# Patient Record
Sex: Male | Born: 1985
Health system: Southern US, Community
[De-identification: ages and names within clinical notes are randomized; demographics above are authoritative.]

---
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
## 2005-12-29 HISTORY — PX: SPINE SURGERY: SHX786

## 2018-01-12 DIAGNOSIS — N200 Calculus of kidney: Secondary | ICD-10-CM

## 2018-01-12 HISTORY — DX: Calculus of kidney: N20.0

## 2018-10-14 ENCOUNTER — Ambulatory Visit: Payer: Self-pay | Admitting: Family Medicine

## 2018-10-28 ENCOUNTER — Other Ambulatory Visit: Payer: Self-pay

## 2018-10-28 ENCOUNTER — Ambulatory Visit (INDEPENDENT_AMBULATORY_CARE_PROVIDER_SITE_OTHER): Payer: 59 | Admitting: Family Medicine

## 2018-10-28 ENCOUNTER — Encounter: Payer: Self-pay | Admitting: Family Medicine

## 2018-10-28 VITALS — BP 119/65 | HR 52 | Temp 98.5°F | Wt 166.0 lb

## 2018-10-28 DIAGNOSIS — Z130 Encounter for screening for diseases of the blood and blood-forming organs and certain disorders involving the immune mechanism: Secondary | ICD-10-CM

## 2018-10-28 DIAGNOSIS — Z13220 Encounter for screening for lipoid disorders: Secondary | ICD-10-CM

## 2018-10-28 DIAGNOSIS — Z0001 Encounter for general adult medical examination with abnormal findings: Secondary | ICD-10-CM

## 2018-10-28 DIAGNOSIS — R7989 Other specified abnormal findings of blood chemistry: Secondary | ICD-10-CM

## 2018-10-28 DIAGNOSIS — Z9889 Other specified postprocedural states: Secondary | ICD-10-CM

## 2018-10-28 DIAGNOSIS — Z808 Family history of malignant neoplasm of other organs or systems: Secondary | ICD-10-CM

## 2018-10-28 DIAGNOSIS — Z131 Encounter for screening for diabetes mellitus: Secondary | ICD-10-CM

## 2018-10-28 DIAGNOSIS — Z0000 Encounter for general adult medical examination without abnormal findings: Secondary | ICD-10-CM

## 2018-10-28 DIAGNOSIS — Z1283 Encounter for screening for malignant neoplasm of skin: Secondary | ICD-10-CM

## 2018-10-28 DIAGNOSIS — Z1329 Encounter for screening for other suspected endocrine disorder: Secondary | ICD-10-CM

## 2018-10-28 DIAGNOSIS — Z87442 Personal history of urinary calculi: Secondary | ICD-10-CM

## 2018-10-28 NOTE — Progress Notes (Signed)
Subjective:    Patient ID: Justin Morrow, male    DOB: 07-20-1985, 33 y.o.   MRN: [id number]  HPI Justin Morrow is a 33 y.o. male Presents today for: Chief Complaint  Patient presents with  . Establish Care    Kidney stone several weeks ago and want to discuss with Dr Sara Chu. Hx of abnormal liver enzymes  new patient to establish care.  Previously lived in [place], [place]. 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 Moved for  Trained at [employer], then doctorate in [place].  No chronic meds.  Married, wife is staying at home with 61moold daughter, Justin Morrow   Surgical hx:  Arthroscopic meniscal surgery  R side - ?congenital meniscus issue.  L spine 2March 18, 2007 cervical surgery for spinal stenosis.   [facility]. Prior stone 5 yrs ago. Has not met with urology. No problems since. Rare soda. No hematuria.   Elevated LFTs:  Noted in 203-18-17  Had life insurance refused prior due to elevated GGT/elevated liver tests. Followed by PCP and gastroenterology. Had negative hepatitis testing, U/S, CT scan, contrast study. No source found for elevated readings.  At that time was more overweight - has lost about 10-15 pounds since that time with running, and sugar consumption.  Repeat tests improved about 1.5-2 yrs ago.  Alcohol:about once per month, less than 1 glass.  Acetaminophen: rare - every 2 weeks.  Off supplements.   Would like ENT referral Over 20 surgeries to ears since [place]. Numerous tympanostomy tubes. Dec 19 in left ear had removal of tube.  Reconstructive surgery on R ear in 2003-18-2015 [place]  No acute sx's - needs local ENT established.   Derm referral FH of skin CA - mother deceased form Melanoma in 2003-17-08 Multiple dysplastic nevi in past, no personal melanoma history.  Younger brother with stage 1 melanoma. Derm eval every 4-6 months prior.    There is no  immunization history on file for this patient. Flu vaccine 2 weeks ago at [facility].  Tetanus: will contact prior [facility].   Depression screen PHQ 2/9 10/28/2018  Decreased Interest 0  Down, Depressed, Hopeless 0  PHQ - 2 Score 0   Dentist: not yet.   Exercise: runner.   STI testing - deferred  There is no height or weight on file to calculate BMI.  There are no active problems to display for this patient.  No past medical history on file.  Not on File Prior to Admission medications   Not on File   Social History   Socioeconomic History  . Marital status: Married    Spouse name: Not on file  . Number of children: Not on file  . Years of education: Not on file  . Highest education level: Not on file  Occupational History  . Not on file  Social Needs  . Financial resource strain: Not on file  . Food insecurity    Worry: Not on file    Inability: Not on file  . Transportation needs    Medical: Not on file    Non-medical: Not on file  Tobacco Use  . Smoking status: Not on file  Substance and Sexual Activity  . Alcohol use: Not on file  . Drug use: Not on file  . Sexual activity: Not on file  Lifestyle  . Physical activity    Days per week: Not on file  Minutes per session: Not on file  . Stress: Not on file  Relationships  . Social Herbalist on phone: Not on file    Gets together: Not on file    Attends religious service: Not on file    Active member of club or organization: Not on file    Attends meetings of clubs or organizations: Not on file    Relationship status: Not on file  . Intimate partner violence    Fear of current or ex partner: Not on file    Emotionally abused: Not on file    Physically abused: Not on file    Forced sexual activity: Not on file  Other Topics Concern  . Not on file  Social History Narrative  . Not on file    Review of Systems Per HPI.     Objective:   Physical Exam Vitals signs reviewed.   Constitutional:      Appearance: He is well-developed.  HENT:     Head: Normocephalic and atraumatic.     Right Ear: External ear normal.     Left Ear: External ear normal.  Eyes:     Conjunctiva/sclera: Conjunctivae normal.     Pupils: Pupils are equal, round, and reactive to light.  Neck:     Musculoskeletal: Normal range of motion and neck supple.     Thyroid: No thyromegaly.  Cardiovascular:     Rate and Rhythm: Normal rate and regular rhythm.     Heart sounds: Normal heart sounds.  Pulmonary:     Effort: Pulmonary effort is normal. No respiratory distress.     Breath sounds: Normal breath sounds. No wheezing.  Abdominal:     General: There is no distension.     Palpations: Abdomen is soft.     Tenderness: There is no abdominal tenderness.     Hernia: There is no hernia in the left inguinal area or right inguinal area.  Genitourinary:    Prostate: Normal.  Musculoskeletal: Normal range of motion.        General: No tenderness.  Lymphadenopathy:     Cervical: No cervical adenopathy.  Skin:    General: Skin is warm and dry.  Neurological:     Mental Status: He is alert and oriented to person, place, and time.     Deep Tendon Reflexes: Reflexes are normal and symmetric.  Psychiatric:        Mood and Affect: Mood normal.        Behavior: Behavior normal.    Vitals:   10/28/18 1411  BP: 119/65  Pulse: (!) 52  Temp: 98.5 F (36.9 C)  TempSrc: Oral  SpO2: 98%  Weight: 166 lb (75.3 kg)       Assessment & Plan:  Justin Morrow is a 33 y.o. male Annual physical exam  --anticipatory guidance as below in AVS, screening labs above. Health maintenance items as above in HPI discussed/recommended as applicable.   Elevated LFTs - Plan: Comprehensive metabolic panel, Gamma GT  -Reported history with significant work-up, no known cause.  Check levels, consider local GI eval/follow-up if needed.  Screening for diabetes mellitus - Plan: Comprehensive metabolic panel   Screening for hyperlipidemia - Plan: Lipid Panel  History of nephrolithiasis - Plan: Ambulatory referral to Urology  -2 episodes, refer to urology.  Asymptomatic at present.  Screening for thyroid disorder - Plan: TSH  Screening, anemia, deficiency, iron - Plan: CBC  History of tympanomastoidectomy - Plan: Ambulatory referral to ENT  -  Multiple previous ENT surgeries/procedures.  Asymptomatic at present, refer to ENT for ongoing care/follow-up.  Screening for skin cancer - Plan: Ambulatory referral to Dermatology Family history of malignant melanoma - Plan: Ambulatory referral to Dermatology  -Refer to dermatology to establish and for ongoing melanoma screening.  No orders of the defined types were placed in this encounter.  Patient Instructions   I will refer you to ear nose and throat, dermatology and urology.  Depending on liver function tests can decide on further work-up or referral to gastroenterologist locally.  Thanks for coming in today and please let me know if there are questions.  [facility]  Address: [address], [place], [place] [address]  [email or web address]  Phone: [phone]  [facility], [place] [facility],  or [place] as options for eye care options if needed.   Keeping you healthy  Get these tests  Blood pressure- Have your blood pressure checked once a year by your healthcare provider.  Normal blood pressure is 120/80.  Weight- Have your body mass index (BMI) calculated to screen for obesity.  BMI is a measure of body fat based on height and weight. You can also calculate your own BMI at [employer]velBags.it.  Cholesterol- Have your cholesterol checked regularly starting at age 7, sooner may be necessary if you have diabetes, high blood pressure, if a family member developed heart diseases at an early age or if you smoke.   Chlamydia, HIV, and other sexual transmitted disease- Get screened each year until the age of 26 then within  three months of each new sexual partner.  Diabetes- Have your blood sugar checked regularly if you have high blood pressure, high cholesterol, a family history of diabetes or if you are overweight.  Get these vaccines  Flu shot- Every fall.  Tetanus shot- Every 10 years.  Menactra- Single dose; prevents meningitis.  Take these steps  Don't smoke- If you do smoke, ask your healthcare provider about quitting. For tips on how to quit, go to www.smokefree.gov or call 1-800-QUIT-NOW.  Be physically active- Exercise 5 days a week for at least 30 minutes.  If you are not already physically active start slow and gradually work up to 30 minutes of moderate physical activity.  Examples of moderate activity include walking briskly, mowing the yard, dancing, swimming bicycling, etc.  Eat a healthy diet- Eat a variety of healthy foods such as fruits, vegetables, low fat milk, low fat cheese, yogurt, lean meats, poultry, fish, beans, tofu, etc.  For more information on healthy eating, go to www.thenutritionsource.org  Drink alcohol in moderation- Limit alcohol intake two drinks or less a day.  Never drink and drive.  Dentist- Brush and floss teeth twice daily; visit your dentis twice a year.  Depression-Your emotional health is as important as your physical health.  If you're feeling down, losing interest in things you normally enjoy please talk with your healthcare provider.  Gun Safety- If you keep a gun in your home, keep it unloaded and with the safety lock on.  Bullets should be stored separately.  Helmet use- Always wear a helmet when riding a motorcycle, bicycle, rollerblading or skateboarding.  Safe sex- If you may be exposed to a sexually transmitted infection, use a condom  Seat belts- Seat bels can save your life; always wear one.  Smoke/Carbon Monoxide detectors- These detectors need to be installed on the appropriate level of your home.  Replace batteries at least once a year.   Skin Cancer- When out in  the sun, cover up and use sunscreen SPF 15 or higher.  Violence- If anyone is threatening or hurting you, please tell your healthcare provider.   If you have lab work done today you will be contacted with your lab results within the next 2 weeks.  If you have not heard from [place] then please contact us. The fastest way to get your results is to register for My Chart.   IF you received an x-ray today, you will receive an invoice from [facility]. Please contact [facility] at [phone] with questions or concerns regarding your invoice.   IF you received labwork today, you will receive an invoice from [facility]. Please contact [facility] at [phone] with questions or concerns regarding your invoice.   Our billing staff will not be able to assist you with questions regarding bills from these companies.  You will be contacted with the lab results as soon as they are available. The fastest way to get your results is to activate your My Chart account. Instructions are located on the last page of this paperwork. If you have not heard from [place] regarding the results in 2 weeks, please contact this office.       Signed,   Merri Ray, MD [facility].  10/29/18 10:45 AM

## 2018-10-28 NOTE — Patient Instructions (Addendum)
I will refer you to ear nose and throat, dermatology and urology.  Depending on liver function tests can decide on further work-up or referral to gastroenterologist locally.  Thanks for coming in today and please let me know if there are questions.  [facility]  Address: [address], [place], [place] [address]  [email or web address]  Phone: [phone]  [facility] Ables, [place] [facility],  or [place] as options for eye care options if needed.   Keeping you healthy  Get these tests  Blood pressure- Have your blood pressure checked once a year by your healthcare provider.  Normal blood pressure is 120/80.  Weight- Have your body mass index (BMI) calculated to screen for obesity.  BMI is a measure of body fat based on height and weight. You can also calculate your own BMI at [employer]Bags[employer]it.  Cholesterol- Have your cholesterol checked regularly starting at age 47, sooner may be necessary if you have diabetes, high blood pressure, if a family member developed heart diseases at an early age or if you smoke.   Chlamydia, HIV, and other sexual transmitted disease- Get screened each year until the age of 67 then within three months of each new sexual partner.  Diabetes- Have your blood sugar checked regularly if you have high blood pressure, high cholesterol, a family history of diabetes or if you are overweight.  Get these vaccines  Flu shot- Every fall.  Tetanus shot- Every 10 years.  Menactra- Single dose; prevents meningitis.  Take these steps  Don't smoke- If you do smoke, ask your healthcare provider about quitting. For tips on how to quit, go to www.smokefree.gov or call 1-800-QUIT-NOW.  Be physically active- Exercise 5 days a week for at least 30 minutes.  If you are not already physically active start slow and gradually work up to 30 minutes of moderate physical activity.  Examples of moderate activity include walking briskly, mowing the yard, dancing,  swimming bicycling, etc.  Eat a healthy diet- Eat a variety of healthy foods such as fruits, vegetables, low fat milk, low fat cheese, yogurt, lean meats, poultry, fish, beans, tofu, etc.  For more information on healthy eating, go to www.thenutritionsource.org  Drink alcohol in moderation- Limit alcohol intake two drinks or less a day.  Never drink and drive.  Dentist- Brush and floss teeth twice daily; visit your dentis twice a year.  Depression-Your emotional health is as important as your physical health.  If you're feeling down, losing interest in things you normally enjoy please talk with your healthcare provider.  Gun Safety- If you keep a gun in your home, keep it unloaded and with the safety lock on.  Bullets should be stored separately.  Helmet use- Always wear a helmet when riding a motorcycle, bicycle, rollerblading or skateboarding.  Safe sex- If you may be exposed to a sexually transmitted infection, use a condom  Seat belts- Seat bels can save your life; always wear one.  Smoke/Carbon Monoxide detectors- These detectors need to be installed on the appropriate level of your home.  Replace batteries at least once a year.  Skin Cancer- When out in the sun, cover up and use sunscreen SPF 15 or higher.  Violence- If anyone is threatening or hurting you, please tell your healthcare provider.   If you have lab work done today you will be contacted with your lab results within the next 2 weeks.  If you have not heard from [place] then please contact us. The fastest way to get your  results is to register for My Chart.   IF you received an x-ray today, you will receive an invoice from [facility]. Please contact [facility] at [phone] with questions or concerns regarding your invoice.   IF you received labwork today, you will receive an invoice from [place] [facility]. Please contact [facility] at [phone] with questions or concerns regarding your invoice.   Our billing  staff will not be able to assist you with questions regarding bills from these companies.  You will be contacted with the lab results as soon as they are available. The fastest way to get your results is to activate your My Chart account. Instructions are located on the last page of this paperwork. If you have not heard from [place] regarding the results in 2 weeks, please contact this office.

## 2018-10-29 ENCOUNTER — Encounter: Payer: Self-pay | Admitting: Family Medicine

## 2018-10-29 LAB — LIPID PANEL
Chol/HDL Ratio: 3.7 ratio (ref 0.0–5.0)
Cholesterol, Total: 201 mg/dL — ABNORMAL HIGH (ref 100–199)
HDL: 55 mg/dL (ref >39)
LDL Chol Calc (NIH): 126 mg/dL — ABNORMAL HIGH (ref 0–99)
Triglycerides: 110 mg/dL (ref 0–149)
VLDL Cholesterol Cal: 20 mg/dL (ref 5–40)

## 2018-10-29 LAB — CBC
Hematocrit: 40.9 % (ref 37.5–51.0)
Hemoglobin: 14 g/dL (ref 13.0–17.7)
MCH: 29.8 pg (ref 26.6–33.0)
MCHC: 34.2 g/dL (ref 31.5–35.7)
MCV: 87 fL (ref 79–97)
Platelets: 280 10*3/uL (ref 150–450)
RBC: 4.7 x10E6/uL (ref 4.14–5.80)
RDW: 12.7 % (ref 11.6–15.4)
WBC: 6.9 10*3/uL (ref 3.4–10.8)

## 2018-10-29 LAB — COMPREHENSIVE METABOLIC PANEL
ALT: 52 IU/L — ABNORMAL HIGH (ref 0–44)
AST: 33 IU/L (ref 0–40)
Albumin/Globulin Ratio: 1.8 (ref 1.2–2.2)
Albumin: 4.7 g/dL (ref 4.0–5.0)
Alkaline Phosphatase: 66 IU/L (ref 39–117)
BUN/Creatinine Ratio: 12 (ref 9–20)
BUN: 11 mg/dL (ref 6–20)
Bilirubin Total: 0.5 mg/dL (ref 0.0–1.2)
CO2: 22 mmol/L (ref 20–29)
Calcium: 9.8 mg/dL (ref 8.7–10.2)
Chloride: 105 mmol/L (ref 96–106)
Creatinine, Ser: 0.92 mg/dL (ref 0.76–1.27)
GFR calc Af Amer: 125 mL/min/{1.73_m2} (ref >59)
GFR calc non Af Amer: 108 mL/min/{1.73_m2} (ref >59)
Globulin, Total: 2.6 g/dL (ref 1.5–4.5)
Glucose: 101 mg/dL — ABNORMAL HIGH (ref 65–99)
Potassium: 4.4 mmol/L (ref 3.5–5.2)
Sodium: 140 mmol/L (ref 134–144)
Total Protein: 7.3 g/dL (ref 6.0–8.5)

## 2018-10-29 LAB — TSH: TSH: 1.18 u[IU]/mL (ref 0.450–4.500)

## 2018-10-29 LAB — GAMMA GT: GGT: 198 IU/L — ABNORMAL HIGH (ref 0–65)

## 2019-04-13 ENCOUNTER — Ambulatory Visit: Payer: 59 | Admitting: Family Medicine

## 2019-06-16 ENCOUNTER — Other Ambulatory Visit: Payer: Self-pay

## 2019-06-16 ENCOUNTER — Ambulatory Visit (INDEPENDENT_AMBULATORY_CARE_PROVIDER_SITE_OTHER): Payer: No Typology Code available for payment source | Admitting: Family Medicine

## 2019-06-16 ENCOUNTER — Encounter: Payer: Self-pay | Admitting: Physician Assistant

## 2019-06-16 ENCOUNTER — Ambulatory Visit (INDEPENDENT_AMBULATORY_CARE_PROVIDER_SITE_OTHER): Payer: No Typology Code available for payment source

## 2019-06-16 ENCOUNTER — Encounter: Payer: Self-pay | Admitting: Family Medicine

## 2019-06-16 VITALS — BP 122/73 | HR 87 | Temp 98.6°F | Ht 66.0 in | Wt 162.0 lb

## 2019-06-16 DIAGNOSIS — Z23 Encounter for immunization: Secondary | ICD-10-CM

## 2019-06-16 DIAGNOSIS — R739 Hyperglycemia, unspecified: Secondary | ICD-10-CM

## 2019-06-16 DIAGNOSIS — E785 Hyperlipidemia, unspecified: Secondary | ICD-10-CM

## 2019-06-16 DIAGNOSIS — Z1283 Encounter for screening for malignant neoplasm of skin: Secondary | ICD-10-CM | POA: Diagnosis not present

## 2019-06-16 DIAGNOSIS — Z9889 Other specified postprocedural states: Secondary | ICD-10-CM | POA: Diagnosis not present

## 2019-06-16 DIAGNOSIS — M25561 Pain in right knee: Secondary | ICD-10-CM

## 2019-06-16 DIAGNOSIS — R7989 Other specified abnormal findings of blood chemistry: Secondary | ICD-10-CM | POA: Diagnosis not present

## 2019-06-16 DIAGNOSIS — Z808 Family history of malignant neoplasm of other organs or systems: Secondary | ICD-10-CM

## 2019-06-16 NOTE — Progress Notes (Signed)
Subjective:  Patient ID: Justin Morrow, male    DOB: Jul 02, 1985  Age: 34 y.o. MRN: [id number]  CC:  Chief Complaint  Patient presents with   Liver recheck    Pt is here for a 6 month recheck of his liver.   Referral    to dermatology and ENT. Pt has new insurance that requiers a referral to see these providers.    Knee Pain    Pt is experiancing some pain in his R knee. no known trama to area. started about 2-3 months ago. pain is come and go. pain is sharp; pain level is a 5/10 on it's worst day.    HPI Jamarl Pew presents for   Elevated LFTs: Discussed at initial visit in October 2020.  Had significant work-up previously without known cause, including reported hepatitis testing, ultrasound, CT scans, contrast studies.  Had reported being overweight at that time but lost 10 to 15 pounds, with improved readings approximately 2 years ago.  Alcohol less than once per month, rare acetaminophen - once per month.   Plan for continued monitoring. Would like to meet with GI locally.  No n/v/abd pain.   Lab Results  Component Value Date   ALT 52 (H) 10/28/2018   AST 33 10/28/2018   GGT 198 (H) 10/28/2018   ALKPHOS 66 10/28/2018   BILITOT 0.5 10/28/2018    ENT referral, history of TM tubes, prior surgeries previous referral in October. Never saw ENT - did not receive call.   Dermatology referral, family history of skin cancer, referred previously, new referral needed for insurance purposes. Dr. Wilhemina Bonito at [facility].   Hyperglycemia: Borderline with glucose 101 in October 2020.  Mild hyperlipidemia with total cholesterol 201, LDL 126 at that time.   Right knee pain: 4-5 months. Sporadic, sometimes at beginning of run then improves.  NKI.  Running, with more hills here, some increased mileage.  Tried resistance band training - helped some. Less frequent and milder pain. Locking/giving way a few times - last one few days ago.  No swelling.  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  Tx: none.   Moderna vaccine in February.   History There are no problems to display for this patient.  No past medical history on file. Past Surgical History:  Procedure Laterality Date   KNEE ARTHROSCOPY  1999   SPINE SURGERY  12/29/2005   No Known Allergies Prior to Admission medications   Medication Sig Start Date End Date Taking? Authorizing Provider  hydrocortisone 1 % ointment Apply 1 application topically 2 (two) times daily.    [provider]  Pyrithione Zinc (DHS ZINC) 2 % SHAM Apply topically.    [provider]   Social History   Socioeconomic History   Marital status: Married    Spouse name: Not on file   Number of children: Not on file   Years of education: Not on file   Highest education level: Not on file  Occupational History   Not on file  Tobacco Use   Smoking status: Never Smoker   Smokeless tobacco: Never Used  Substance and Sexual Activity   Alcohol use: Yes    Alcohol/week: 1.0 standard drinks    Types: 1 Glasses of wine per week    Comment: once a month   Drug use: Never   Sexual activity: Yes  Other Topics Concern   Not on file  Social History Narrative   Not on file  Social Determinants of Health   Financial Resource Strain:    Difficulty of Paying Living Expenses:   Food Insecurity:    Worried About Charity fundraiser in the Last Year:    Arboriculturist in the Last Year:   Transportation Needs:    Film/video editor (Medical):    Lack of Transportation (Non-Medical):   Physical Activity:    Days of Exercise per Week:    Minutes of Exercise per Session:   Stress:    Feeling of Stress :   Social Connections:    Frequency of Communication with Friends and Family:    Frequency of Social Gatherings with Friends and Family:    Attends Religious Services:    Active Member of Clubs or Organizations:    Attends Archivist Meetings:      Marital Status:   Intimate Partner Violence:    Fear of Current or Ex-Partner:    Emotionally Abused:    Physically Abused:    Sexually Abused:     Review of Systems   Objective:   Vitals:   06/16/19 0821  BP: 122/73  Pulse: 87  Temp: 98.6 F (37 C)  TempSrc: Temporal  SpO2: 98%  Weight: 162 lb (73.5 kg)  Height: 5\' 6"  (1.676 m)     Physical Exam Vitals reviewed.  Constitutional:      Appearance: He is well-developed.  HENT:     Head: Normocephalic and atraumatic.  Eyes:     Pupils: Pupils are equal, round, and reactive to light.  Neck:     Vascular: No carotid bruit or JVD.  Cardiovascular:     Rate and Rhythm: Normal rate and regular rhythm.     Heart sounds: Normal heart sounds. No murmur.  Pulmonary:     Effort: Pulmonary effort is normal.     Breath sounds: Normal breath sounds. No rales.  Musculoskeletal:     Comments: Right knee Skin intact, no effusion, no erythema.  No focal bony tenderness.  Pain-free range of motion.  Slight J sign at patella with flexion extension.  Minimal pain with patellar compression/Clark's.  Negative McMurray, negative Lachman, negative varus and valgus stress testing.  Skin:    General: Skin is warm and dry.  Neurological:     Mental Status: He is alert and oriented to person, place, and time.       Assessment & Plan:  Antonia Culbertson is a 33 y.o. male . Elevated LFTs - Plan: Comprehensive metabolic panel, Gamma GT, Ambulatory referral to Gastroenterology  -Repeat testing, asymptomatic.  Previous evaluation apparently without known cause, referred to GI locally.  Need for prophylactic vaccination with combined diphtheria-tetanus-pertussis (DTP) vaccine - Plan: Tdap vaccine greater than or equal to 7yo IM  History of tympanomastoidectomy - Plan: Ambulatory referral to ENT  Screening for skin cancer - Plan: Ambulatory referral to Dermatology Family history of malignant melanoma - Plan: Ambulatory referral to  Dermatology  Hyperglycemia - Plan: Comprehensive metabolic panel  Right knee pain, unspecified chronicity - Plan: DG Knee Complete 4 Views Right, Ambulatory referral to Physical Therapy  -Exam suspicious for patellofemoral syndrome.  Did have meniscal repair when younger, possible discoid meniscus.  Exam reassuring, including McMurray's at this time without effusion.  Reported locking but again reassuring exam at present.  Check x-ray, VMO strengthening, handout given, PT eval, recheck 3 to 4 weeks.  Consider advanced imaging or orthopedic evaluation if persistent.  Hyperlipidemia, unspecified hyperlipidemia type - Plan: Lipid panel  -  Mild elevation previously.  Repeat labs.  No orders of the defined types were placed in this encounter.  Patient Instructions    Knee pain could be component of patellofemoral pain syndrome.  See information below.  With previous meniscus repair, that is also a possibility but reassuring exam today.  I will let you know if there are concerns on x-ray but we can try physical therapy first.  If not improving in the next 3 to 4 weeks, follow-up as may need MRI or orthopedic evaluation.  Follow-up sooner if worse.  I will refer you to ear nose and throat, dermatology and gastroenterology, repeat labs today.  Thanks for coming in. Patellofemoral Pain Syndrome  Patellofemoral pain syndrome is a condition in which the tissue (cartilage) on the underside of the kneecap (patella) softens or breaks down. This causes pain in the front of the knee. The condition is also called runner's knee or chondromalacia patella. Patellofemoral pain syndrome is most common in young adults who are active in sports. The knee is the largest joint in the body. The patella covers the front of the knee and is attached to muscles above and below the knee. The underside of the patella is covered with a smooth type of cartilage (synovium). The smooth surface helps the patella to glide easily when  you move your knee. Patellofemoral pain syndrome causes swelling in the joint linings and bone surfaces in the knee. What are the causes? This condition may be caused by:  Overuse of the knee.  Poor alignment of your knee joints.  Weak leg muscles.  A direct blow to your kneecap. What increases the risk? You are more likely to develop this condition if:  You do a lot of activities that can wear down your kneecap. These include: ? Running. ? Squatting. ? Climbing stairs.  You start a new physical activity or exercise program.  You wear shoes that do not fit well.  You do not have good leg strength.  You are overweight. What are the signs or symptoms? The main symptom of this condition is knee pain. This may feel like a dull, aching pain underneath your patella, in the front of your knee. There may be a popping or cracking sound when you move your knee. Pain may get worse with:  Exercise.  Climbing stairs.  Running.  Jumping.  Squatting.  Kneeling.  Sitting for a long time.  Moving or pushing on your patella. How is this diagnosed? This condition may be diagnosed based on:  Your symptoms and medical history. You may be asked about your recent physical activities and which ones cause knee pain.  A physical exam. This may include: ? Moving your patella back and forth. ? Checking your range of knee motion. ? Having you squat or jump to see if you have pain. ? Checking the strength of your leg muscles.  Imaging tests to confirm the diagnosis. These may include an MRI of your knee. How is this treated? This condition may be treated at home with rest, ice, compression, and elevation (RICE).  Other treatments may include:  Nonsteroidal anti-inflammatory drugs (NSAIDs).  Physical therapy to stretch and strengthen your leg muscles.  Shoe inserts (orthotics) to take stress off your knee.  A knee brace or knee support.  Adhesive tapes to the skin.  Surgery to  remove damaged cartilage or move the patella to a better position. This is rare. Follow these instructions at home: If you have a shoe or brace:  Wear the shoe or brace as told by your health care provider. Remove it only as told by your health care provider.  Loosen the shoe or brace if your toes tingle, become numb, or turn cold and blue.  Keep the shoe or brace clean.  If the shoe or brace is not waterproof: ? Do not let it get wet. ? Cover it with a watertight covering when you take a bath or a shower. Managing pain, stiffness, and swelling  If directed, put ice on the painful area. ? If you have a removable shoe or brace, remove it as told by your health care provider. ? Put ice in a plastic bag. ? Place a towel between your skin and the bag. ? Leave the ice on for 20 minutes, 2-3 times a day.  Move your toes often to avoid stiffness and to lessen swelling.  Rest your knee: ? Avoid activities that cause knee pain. ? When sitting or lying down, raise (elevate) the injured area above the level of your heart, whenever possible. General instructions  Take over-the-counter and prescription medicines only as told by your health care provider.  Use splints, braces, knee supports, or walking aids as directed by your health care provider.  Perform stretching and strengthening exercises as told by your health care provider or physical therapist.  Do not use any products that contain nicotine or tobacco, such as cigarettes and e-cigarettes. These can delay healing. If you need help quitting, ask your health care provider.  Return to your normal activities as told by your health care provider. Ask your health care provider what activities are safe for you.  Keep all follow-up visits as told by your health care provider. This is important. Contact a health care provider if:  Your symptoms get worse.  You are not improving with home care. Summary  Patellofemoral pain syndrome is  a condition in which the tissue (cartilage) on the underside of the kneecap (patella) softens or breaks down.  This condition causes swelling in the joint linings and bone surfaces in the knee. This leads to pain in the front of the knee.  This condition may be treated at home with rest, ice, compression, and elevation (RICE).  Use splints, braces, knee supports, or walking aids as directed by your health care provider. This information is not intended to replace advice given to you by your health care provider. Make sure you discuss any questions you have with your health care provider. Document Revised: 02/08/2017 Document Reviewed: 02/08/2017 Elsevier Patient Education  [employer].   If you have lab work done today you will be contacted with your lab results within the next 2 weeks.  If you have not heard from [place] then please contact us. The fastest way to get your results is to register for My Chart.   IF you received an x-ray today, you will receive an invoice from [facility]. Please contact [facility] at [phone] with questions or concerns regarding your invoice.   IF you received labwork today, you will receive an invoice from [facility]. Please contact [facility] at [phone] with questions or concerns regarding your invoice.   Our billing staff will not be able to assist you with questions regarding bills from these companies.  You will be contacted with the lab results as soon as they are available. The fastest way to get your results is to activate your My Chart account. Instructions are located on the last page of this paperwork. If  you have not heard from [place] regarding the results in 2 weeks, please contact this office.         Signed, Merri Ray, MD [facility]

## 2019-06-16 NOTE — Patient Instructions (Addendum)
Knee pain could be component of patellofemoral pain syndrome.  See information below.  With previous meniscus repair, that is also a possibility but reassuring exam today.  I will let you know if there are concerns on x-ray but we can try physical therapy first.  If not improving in the next 3 to 4 weeks, follow-up as may need MRI or orthopedic evaluation.  Follow-up sooner if worse.  I will refer you to ear nose and throat, dermatology and gastroenterology, repeat labs today.  Thanks for coming in. Patellofemoral Pain Syndrome  Patellofemoral pain syndrome is a condition in which the tissue (cartilage) on the underside of the kneecap (patella) softens or breaks down. This causes pain in the front of the knee. The condition is also called runner's knee or chondromalacia patella. Patellofemoral pain syndrome is most common in young adults who are active in sports. The knee is the largest joint in the body. The patella covers the front of the knee and is attached to muscles above and below the knee. The underside of the patella is covered with a smooth type of cartilage (synovium). The smooth surface helps the patella to glide easily when you move your knee. Patellofemoral pain syndrome causes swelling in the joint linings and bone surfaces in the knee. What are the causes? This condition may be caused by:  Overuse of the knee.  Poor alignment of your knee joints.  Weak leg muscles.  A direct blow to your kneecap. What increases the risk? You are more likely to develop this condition if:  You do a lot of activities that can wear down your kneecap. These include: ? Running. ? Squatting. ? Climbing stairs.  You start a new physical activity or exercise program.  You wear shoes that do not fit well.  You do not have good leg strength.  You are overweight. What are the signs or symptoms? The main symptom of this condition is knee pain. This may feel like a dull, aching pain underneath  your patella, in the front of your knee. There may be a popping or cracking sound when you move your knee. Pain may get worse with:  Exercise.  Climbing stairs.  Running.  Jumping.  Squatting.  Kneeling.  Sitting for a long time.  Moving or pushing on your patella. How is this diagnosed? This condition may be diagnosed based on:  Your symptoms and medical history. You may be asked about your recent physical activities and which ones cause knee pain.  A physical exam. This may include: ? Moving your patella back and forth. ? Checking your range of knee motion. ? Having you squat or jump to see if you have pain. ? Checking the strength of your leg muscles.  Imaging tests to confirm the diagnosis. These may include an MRI of your knee. How is this treated? This condition may be treated at home with rest, ice, compression, and elevation (RICE).  Other treatments may include:  Nonsteroidal anti-inflammatory drugs (NSAIDs).  Physical therapy to stretch and strengthen your leg muscles.  Shoe inserts (orthotics) to take stress off your knee.  A knee brace or knee support.  Adhesive tapes to the skin.  Surgery to remove damaged cartilage or move the patella to a better position. This is rare. Follow these instructions at home: If you have a shoe or brace:  Wear the shoe or brace as told by your health care provider. Remove it only as told by your health care provider.  Loosen the  shoe or brace if your toes tingle, become numb, or turn cold and blue.  Keep the shoe or brace clean.  If the shoe or brace is not waterproof: ? Do not let it get wet. ? Cover it with a watertight covering when you take a bath or a shower. Managing pain, stiffness, and swelling  If directed, put ice on the painful area. ? If you have a removable shoe or brace, remove it as told by your health care provider. ? Put ice in a plastic bag. ? Place a towel between your skin and the bag. ? Leave  the ice on for 20 minutes, 2-3 times a day.  Move your toes often to avoid stiffness and to lessen swelling.  Rest your knee: ? Avoid activities that cause knee pain. ? When sitting or lying down, raise (elevate) the injured area above the level of your heart, whenever possible. General instructions  Take over-the-counter and prescription medicines only as told by your health care provider.  Use splints, braces, knee supports, or walking aids as directed by your health care provider.  Perform stretching and strengthening exercises as told by your health care provider or physical therapist.  Do not use any products that contain nicotine or tobacco, such as cigarettes and e-cigarettes. These can delay healing. If you need help quitting, ask your health care provider.  Return to your normal activities as told by your health care provider. Ask your health care provider what activities are safe for you.  Keep all follow-up visits as told by your health care provider. This is important. Contact a health care provider if:  Your symptoms get worse.  You are not improving with home care. Summary  Patellofemoral pain syndrome is a condition in which the tissue (cartilage) on the underside of the kneecap (patella) softens or breaks down.  This condition causes swelling in the joint linings and bone surfaces in the knee. This leads to pain in the front of the knee.  This condition may be treated at home with rest, ice, compression, and elevation (RICE).  Use splints, braces, knee supports, or walking aids as directed by your health care provider. This information is not intended to replace advice given to you by your health care provider. Make sure you discuss any questions you have with your health care provider. Document Revised: 02/08/2017 Document Reviewed: 02/08/2017 Elsevier Patient Education  [employer].   If you have lab work done today you will be contacted with your lab  results within the next 2 weeks.  If you have not heard from [place] then please contact us. The fastest way to get your results is to register for My Chart.   IF you received an x-ray today, you will receive an invoice from [facility]. Please contact [facility] at [phone] with questions or concerns regarding your invoice.   IF you received labwork today, you will receive an invoice from [facility]. Please contact [facility] at [phone] with questions or concerns regarding your invoice.   Our billing staff will not be able to assist you with questions regarding bills from these companies.  You will be contacted with the lab results as soon as they are available. The fastest way to get your results is to activate your My Chart account. Instructions are located on the last page of this paperwork. If you have not heard from [place] regarding the results in 2 weeks, please contact this office.

## 2019-06-17 LAB — LIPID PANEL
Chol/HDL Ratio: 4.1 ratio (ref 0.0–5.0)
Cholesterol, Total: 190 mg/dL (ref 100–199)
HDL: 46 mg/dL (ref >39)
LDL Chol Calc (NIH): 118 mg/dL — ABNORMAL HIGH (ref 0–99)
Triglycerides: 147 mg/dL (ref 0–149)
VLDL Cholesterol Cal: 26 mg/dL (ref 5–40)

## 2019-06-17 LAB — COMPREHENSIVE METABOLIC PANEL
ALT: 57 IU/L — ABNORMAL HIGH (ref 0–44)
AST: 39 IU/L (ref 0–40)
Albumin/Globulin Ratio: 2 (ref 1.2–2.2)
Albumin: 4.9 g/dL (ref 4.0–5.0)
Alkaline Phosphatase: 83 IU/L (ref 48–121)
BUN/Creatinine Ratio: 13 (ref 9–20)
BUN: 15 mg/dL (ref 6–20)
Bilirubin Total: 0.6 mg/dL (ref 0.0–1.2)
CO2: 23 mmol/L (ref 20–29)
Calcium: 10 mg/dL (ref 8.7–10.2)
Chloride: 101 mmol/L (ref 96–106)
Creatinine, Ser: 1.12 mg/dL (ref 0.76–1.27)
GFR calc Af Amer: 98 mL/min/{1.73_m2} (ref >59)
GFR calc non Af Amer: 85 mL/min/{1.73_m2} (ref >59)
Globulin, Total: 2.5 g/dL (ref 1.5–4.5)
Glucose: 88 mg/dL (ref 65–99)
Potassium: 4.2 mmol/L (ref 3.5–5.2)
Sodium: 140 mmol/L (ref 134–144)
Total Protein: 7.4 g/dL (ref 6.0–8.5)

## 2019-06-17 LAB — GAMMA GT: GGT: 265 IU/L — ABNORMAL HIGH (ref 0–65)

## 2019-06-20 ENCOUNTER — Telehealth: Payer: Self-pay

## 2019-06-20 NOTE — Telephone Encounter (Signed)
Pt referral for GI pt insurance needs more information.

## 2019-06-26 ENCOUNTER — Encounter: Payer: Self-pay | Admitting: Family Medicine

## 2019-06-26 NOTE — Telephone Encounter (Signed)
Okay to change pt follow up from July 9th to august 9th due to late getting into PT is this okay?

## 2019-06-27 ENCOUNTER — Encounter: Payer: Self-pay | Admitting: Family Medicine

## 2019-06-27 NOTE — Telephone Encounter (Signed)
No problem at all.  Will send to scheduling to reschedule that appointment.  Thanks.

## 2019-07-11 ENCOUNTER — Ambulatory Visit: Payer: No Typology Code available for payment source | Admitting: Physician Assistant

## 2019-07-11 ENCOUNTER — Other Ambulatory Visit (INDEPENDENT_AMBULATORY_CARE_PROVIDER_SITE_OTHER): Payer: No Typology Code available for payment source

## 2019-07-11 ENCOUNTER — Encounter: Payer: Self-pay | Admitting: Physician Assistant

## 2019-07-11 VITALS — BP 110/74 | HR 68 | Ht 65.0 in | Wt 161.2 lb

## 2019-07-11 DIAGNOSIS — R7989 Other specified abnormal findings of blood chemistry: Secondary | ICD-10-CM

## 2019-07-11 LAB — CBC WITH DIFFERENTIAL/PLATELET
Basophils Absolute: 0.1 10*3/uL (ref 0.0–0.1)
Basophils Relative: 1.3 % (ref 0.0–3.0)
Eosinophils Absolute: 0.3 10*3/uL (ref 0.0–0.7)
Eosinophils Relative: 4.8 % (ref 0.0–5.0)
HCT: 40.9 % (ref 39.0–52.0)
Hemoglobin: 13.7 g/dL (ref 13.0–17.0)
Lymphocytes Relative: 32.7 % (ref 12.0–46.0)
Lymphs Abs: 1.8 10*3/uL (ref 0.7–4.0)
MCHC: 33.4 g/dL (ref 30.0–36.0)
MCV: 88.6 fl (ref 78.0–100.0)
Monocytes Absolute: 0.4 10*3/uL (ref 0.1–1.0)
Monocytes Relative: 6.6 % (ref 3.0–12.0)
Neutro Abs: 3 10*3/uL (ref 1.4–7.7)
Neutrophils Relative %: 54.6 % (ref 43.0–77.0)
Platelets: 290 10*3/uL (ref 150.0–400.0)
RBC: 4.61 Mil/uL (ref 4.22–5.81)
RDW: 13.8 % (ref 11.5–15.5)
WBC: 5.5 10*3/uL (ref 4.0–10.5)

## 2019-07-11 LAB — HEPATIC FUNCTION PANEL
ALT: 40 U/L (ref 0–53)
AST: 24 U/L (ref 0–37)
Albumin: 4.7 g/dL (ref 3.5–5.2)
Alkaline Phosphatase: 60 U/L (ref 39–117)
Bilirubin, Direct: 0.2 mg/dL (ref 0.0–0.3)
Total Bilirubin: 1 mg/dL (ref 0.2–1.2)
Total Protein: 7.4 g/dL (ref 6.0–8.3)

## 2019-07-11 LAB — IBC + FERRITIN
Ferritin: 78.3 ng/mL (ref 22.0–322.0)
Iron: 108 ug/dL (ref 42–165)
Saturation Ratios: 29.7 % (ref 20.0–50.0)
Transferrin: 260 mg/dL (ref 212.0–360.0)

## 2019-07-11 LAB — PROTIME-INR
INR: 1 ratio (ref 0.8–1.0)
Prothrombin Time: 11.2 s (ref 9.6–13.1)

## 2019-07-11 LAB — IGA: IgA: 223 mg/dL (ref 68–378)

## 2019-07-11 NOTE — Progress Notes (Signed)
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     08/25/2016 ultrasound the right upper quadrant for elevated liver enzymes was completely normal.    11/02/2016 CBC normal CMP with an ALT elevated at 54, GGT 285 and otherwise normal.  Iron studies normal.  TSH normal.  Alpha-1 antitrypsin, ceruloplasmin, IgA, IgG all normal.  ANA antibody titer was abnormal at 1: 160-homogenous, hepatitis studies negative/normal, ASMA antibody was detected, AMA normal and TTG IgA normal.    11/11/2016 CT of abdomen with contrast showed normal liver appearance without overt enlargement lobulation or scarring.    10/28/2018 ALT elevated at 52, GGT 198, CMP and CBC otherwise normal.  06/16/2019 ALT 57, GGT 265, CMP and CBC otherwise normal.     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  Explains that his liver enzymes have been elevated for at least the past 3 years.  He had a full work-up and saw GI when he lived in [place], he brought these records with him, it is as above.  Explains that they could not find a cause at all and told him to try and lose some weight.  At that time he weighed around 274 pounds and now is down to 161.  He did change his diet slightly and is trying to eat even more healthy.  Also exercises on a regular basis.  He tells me he rarely drinks an alcoholic drink, maybe once every 3 months.  He wonders why this is still elevated.    Father of a 10-year-old.    Denies fever, chills, weight loss, change in bowel habits, abdominal pain, heartburn, reflux, family history of liver disease, IV drug use, supplement use or previous blood transfusions.  History reviewed. No pertinent past medical history.  Past Surgical History:  Procedure Laterality Date  . KNEE ARTHROSCOPY  1999  . SPINE SURGERY  12/29/2005     Current Outpatient Medications  Medication Sig Dispense Refill  . hydrocortisone 1 % ointment Apply 1 application topically 2 (two) times daily.    . Pyrithione Zinc (DHS ZINC) 2 % SHAM Apply topically.     No current facility-administered medications for this visit.    Allergies as of 07/11/2019  . (No Known Allergies)    Family History  Problem Relation Age of Onset  . Cancer Mother   . Healthy Father   . Healthy Sister   . Healthy Brother   . Colon cancer Neg Hx   . Stomach cancer Neg Hx   . Pancreatic cancer Neg Hx   . Esophageal cancer Neg Hx     Social History   Socioeconomic History  . Marital status: Married    Spouse name: Not on file  . Number of children: Not on file  . Years of education: Not on file  . Highest education level: Not on file  Occupational History  . Not on file  Tobacco Use  . Smoking status: Never Smoker  . Smokeless tobacco: Never Used  Vaping Use  . Vaping Use: Never used  Substance and Sexual Activity  . Alcohol use: Yes    Alcohol/week: 1.0 standard drink    Types: 1 Glasses of wine per week    Comment: once a month  . Drug use: Never  . Sexual activity:  Yes  Other Topics Concern  . Not on file  Social History Narrative  . Not on file   Social Determinants of Health   Financial Resource Strain:   . Difficulty of Paying Living Expenses:   Food Insecurity:   . Worried About Charity fundraiser in the Last Year:   . Arboriculturist in the Last Year:   Transportation Needs:   . Film/video editor (Medical):   Marland Kitchen Lack of Transportation (Non-Medical):   Physical Activity:   . Days of Exercise per Week:   . Minutes of Exercise per Session:   Stress:   . Feeling of Stress :   Social Connections:   . Frequency of Communication with Friends and Family:   . Frequency of Social Gatherings with Friends and Family:   . Attends Religious Services:   . Active Member of Clubs or Organizations:   . Attends Theatre manager Meetings:   Marland Kitchen Marital Status:   Intimate Partner Violence:   . Fear of Current or Ex-Partner:   . Emotionally Abused:   Marland Kitchen Physically Abused:   . Sexually Abused:     Review of Systems:    Constitutional: No weight loss, fever or chills Skin: No rash  Cardiovascular: No chest pain Respiratory: No SOB  Gastrointestinal: See HPI and otherwise negative Genitourinary: No dysuria Neurological: No headache, dizziness or syncope Musculoskeletal: No new muscle or joint pain Hematologic: No bleeding  Psychiatric: No history of depression or anxiety   Physical Exam:  Vital signs: BP 110/74   Pulse 68   Ht 5\' 5"  (1.651 m)   Wt 161 lb 4 oz (73.1 kg)   BMI 26.83 kg/m   Constitutional:   Pleasant Caucasian male appears to be in NAD, Well developed, Well nourished, alert and cooperative Head:  Normocephalic and atraumatic. Eyes:   PEERL, EOMI. No icterus. Conjunctiva pink. Ears:  Normal auditory acuity. Neck:  Supple Throat: Oral cavity and pharynx without inflammation, swelling or lesion.  Respiratory: Respirations even and unlabored. Lungs clear to auscultation bilaterally.   No wheezes, crackles, or rhonchi.  Cardiovascular: Normal S1, S2. No MRG. Regular rate and rhythm. No peripheral edema, cyanosis or pallor.  Gastrointestinal:  Soft, nondistended, nontender. No rebound or guarding. Normal bowel sounds. No appreciable masses or hepatomegaly. Rectal:  Not performed.  Msk:  Symmetrical without gross deformities. Without edema, no deformity or joint abnormality.  Neurologic:  Alert and  oriented x4;  grossly normal neurologically.  Skin:   Dry and intact without significant lesions or rashes. Psychiatric: Demonstrates good judgement and reason without abnormal affect or behaviors.  RELEVANT LABS AND IMAGING (and see HPI): CBC    Component Value Date/Time   WBC 6.9 10/28/2018 1537   RBC 4.70 10/28/2018 1537   HGB 14.0 10/28/2018 1537   HCT 40.9 10/28/2018 1537   PLT  280 10/28/2018 1537   MCV 87 10/28/2018 1537   MCH 29.8 10/28/2018 1537   MCHC 34.2 10/28/2018 1537   RDW 12.7 10/28/2018 1537    CMP     Component Value Date/Time   NA 140 06/16/2019 0910   K 4.2 06/16/2019 0910   CL 101 06/16/2019 0910   CO2 23 06/16/2019 0910   GLUCOSE 88 06/16/2019 0910   BUN 15 06/16/2019 0910   CREATININE 1.12 06/16/2019 0910   CALCIUM 10.0 06/16/2019 0910   PROT 7.4 06/16/2019 0910   ALBUMIN 4.9 06/16/2019 0910   AST 39 06/16/2019 0910   ALT  57 (H) 06/16/2019 0910   ALKPHOS 83 06/16/2019 0910   BILITOT 0.6 06/16/2019 0910   GFRNONAA 85 06/16/2019 0910   GFRAA 98 06/16/2019 0910    Assessment: 1.  Elevated LFTs: Previous work-up in 2018 with positive ASMA and ANA antibody titer 1-1 60-homogenous pattern, ALT and GGT elevated, ultrasound and CT unrevealing, patient is still wondering why this is elevated as there was never a cause found  Plan: 1.  Discussed that I do not feel like this minor elevation in liver enzymes is concerning, but since the patient is concerned will repeat work-up at this time and compare results. 2.  Ordered repeat LFTs, CBC, PT/INR, iron studies, TTG and IgA, ceruloplasmin, ANA, alpha-1 antitrypsin, ASMA, AMA as well as right upper quadrant ultrasound 3.  We will compare results above to previous, if patient is still questioning then could consider liver biopsy in the future if felt necessary. 4.  Patient to follow in clinic per recommendations after lab results above.  He was assigned to Dr. Havery Moros this morning.  Ellouise Newer, PA-C [facility] 07/11/2019, 8:28 AM  Cc: Wendie Agreste, MD

## 2019-07-11 NOTE — Patient Instructions (Signed)
If you are age 35 or older, your body mass index should be between 23-30. Your Body mass index is 26.83 kg/m. If this is out of the aforementioned range listed, please consider follow up with your Primary Care Provider.  If you are age 55 or younger, your body mass index should be between 19-25. Your Body mass index is 26.83 kg/m. If this is out of the aformentioned range listed, please consider follow up with your Primary Care Provider.   Your provider has requested that you go to the basement level for lab work before leaving today. Press "B" on the elevator. The lab is located at the first door on the left as you exit the elevator.  You have been scheduled for an abdominal ultrasound at [facility] (1st floor of hospital) on Wednesday 07/19/19 at 8:30 am. Please arrive 15 minutes prior to your appointment for registration. Make certain not to have anything to eat or drink 6 hours prior to your appointment. Should you need to reschedule your appointment, please contact radiology at [phone]. This test typically takes about 30 minutes to perform.  Due to recent changes in healthcare laws, you may see the results of your imaging and laboratory studies on MyChart before your provider has had a chance to review them.  We understand that in some cases there may be results that are confusing or concerning to you. Not all laboratory results come back in the same time frame and the provider may be waiting for multiple results in order to interpret others.  Please give [place] 48 hours in order for your provider to thoroughly review all the results before contacting the office for clarification of your results.

## 2019-07-12 NOTE — Progress Notes (Signed)
Agree with assessment and plan as outlined. Very mild ALT elevation, negative workup previously. Repeat US is not unreasonable to re-evaluate for steatosis, etc. Otherwise, will await repeat labs. Agree if workup unrevealing and ALT elevation persists, next step would be liver biopsy, although that may be a bit aggressive for such a mild elevation, would have to discuss with him, would otherwise monitor this periodically.

## 2019-07-14 LAB — ANTI-NUCLEAR AB-TITER (ANA TITER)
ANA TITER: 1:320 {titer} — ABNORMAL HIGH
ANA Titer 1: 1:40 {titer} — ABNORMAL HIGH

## 2019-07-14 LAB — ANA: Anti Nuclear Antibody (ANA): POSITIVE — AB

## 2019-07-14 LAB — MITOCHONDRIAL ANTIBODIES: Mitochondrial M2 Ab, IgG: <=20 U

## 2019-07-14 LAB — TISSUE TRANSGLUTAMINASE ABS,IGG,IGA
(tTG) Ab, IgA: 1 U/mL
(tTG) Ab, IgG: 2 U/mL

## 2019-07-14 LAB — ANTI-SMOOTH MUSCLE ANTIBODY, IGG: Actin (Smooth Muscle) Antibody (IGG): <20 U (ref <20)

## 2019-07-14 LAB — CERULOPLASMIN: Ceruloplasmin: 24 mg/dL (ref 18–36)

## 2019-07-14 LAB — ALPHA-1-ANTITRYPSIN: A-1 Antitrypsin, Ser: 133 mg/dL (ref 83–199)

## 2019-07-18 ENCOUNTER — Other Ambulatory Visit: Payer: Self-pay

## 2019-07-18 ENCOUNTER — Encounter: Payer: Self-pay | Admitting: Physical Therapy

## 2019-07-18 ENCOUNTER — Ambulatory Visit: Payer: No Typology Code available for payment source | Attending: Family Medicine | Admitting: Physical Therapy

## 2019-07-18 DIAGNOSIS — M25561 Pain in right knee: Secondary | ICD-10-CM | POA: Insufficient documentation

## 2019-07-18 DIAGNOSIS — G8929 Other chronic pain: Secondary | ICD-10-CM | POA: Insufficient documentation

## 2019-07-18 DIAGNOSIS — R2689 Other abnormalities of gait and mobility: Secondary | ICD-10-CM | POA: Diagnosis present

## 2019-07-18 NOTE — Patient Instructions (Signed)
Access Code: [email or web address] Prepared by: Carolyne Littles  Exercises .Supine Straight Leg Raises - 1 x daily - 7 x weekly - 3 sets - 10 reps .Single Leg Bridge - 1 x daily - 7 x weekly - 3 sets - 10 reps .Side Stepping with Resistance at Thighs - 1 x daily - 7 x weekly - 3 sets - 10 reps .The Diver - 1 x daily - 7 x weekly - 3 sets - 10 reps

## 2019-07-18 NOTE — Therapy (Signed)
[place], [place], [address] Phone: [phone]   Fax:  [phone]  Physical Therapy Evaluation  Patient Details  Name: Justin Morrow MRN: [id number] Date of Birth: [birth year] Referring Provider (PT): Dr. Wendie Agreste    Encounter Date: 07/18/2019   PT End of Session - 07/18/19 0858    Visit Number 1    Number of Visits 6    Date for PT Re-Evaluation 08/29/19    PT Start Time 0800    PT Stop Time 0844    PT Time Calculation (min) 44 min    Activity Tolerance Patient tolerated treatment well    Behavior During Therapy [facility] for tasks assessed/performed           History reviewed. No pertinent past medical history.  Past Surgical History:  Procedure Laterality Date  . KNEE ARTHROSCOPY  1999  . SPINE SURGERY  12/29/2005    There were no vitals filed for this visit.    Subjective Assessment - 07/18/19 0804    Subjective Pt is a 34 y.o. male c/o R knee pain. Pt states pain began 5-6 months ago. Pain is intermittent, most often occuring when he is running. Pt runs 4-5x a week, 5-6 miles at a time. Pain most often occurs below the knee and on medial side and sometimes radiates down the leg. Patient states some occurences of buckling.    Limitations Other (comment)   running   Diagnostic tests X ray: 1. No fracture or dislocation of the right knee. Joint spaces arepreserved. 2. Small incidental exostosis of the proximal right fibula, whichare occasionally symptomatic.    Patient Stated Goals decrease pain    Currently in Pain? Yes    Pain Score 4     Pain Location Knee    Pain Orientation Right    Pain Descriptors / Indicators Other (Comment)   sharp pain then turns into a dull pain   Pain Type Chronic pain    Pain Radiating Towards down the leg into calf    Pain Onset More than a month ago    Pain Frequency Intermittent    Aggravating Factors  running, intermittently; quick sudden movements,  sometimes going up stairs, kneeling    Pain Relieving Factors theraband exercises, stretching              [facility] PT Assessment - 07/18/19 0001      Assessment   Medical Diagnosis R knee pain    Referring Provider (PT) Dr. Wendie Agreste     Hand Dominance Left    Next MD Visit In August    Prior Therapy Yes    Back     Precautions   Precautions None      Restrictions   Weight Bearing Restrictions No      Balance Screen   Has the patient fallen in the past 6 months No      [place] residence    Home Layout Two level      Prior Function   Vocation Full time employment    Archivist at [employer]   Overall Cognitive Status Within Functional Limits for tasks assessed      Observation/Other Assessments   Focus on Therapeutic Outcomes (FOTO)  12% limitation 7% expected. Reviewed FOTO with patient       Sensation   Light Touch Appears Intact    Additional Comments  debies Arboriculturist Movements are Fluid and Coordinated Yes    Fine Motor Movements are Fluid and Coordinated Yes      Functional Tests   Functional tests Squat;Running;Single leg stance;Step down      Squat   Comments knees came far over toes       Step Down   Comments R knee pain on patellar tendon      Running   Comments lateral whip on the right; lateral heel strike       Single Leg Stance   Comments some instability on both leg   SLS touching cone      ROM / Strength   AROM / PROM / Strength AROM;Strength      AROM   Overall AROM Comments WFL    AROM Assessment Site Knee      Strength   Overall Strength Within functional limits for tasks performed    Overall Strength Comments Straght leg raises fast concentric slow eccentric; single leg bridges; monster walks    Strength Assessment Site Knee;Hip      Palpation   Palpation comment tenderness on patella tendon and medial  knee      Special Tests    Special Tests Knee Special Tests;Laxity/Instability Tests;Meniscus Tests    Other special tests Varus/Valgus test    Knee Special tests  Patellofemoral Grind Test (Clarke's Sign);other    Meniscus Tests McMurray Test      Patellofemoral Grind test (Clark's Sign)   Comments (-)       McMurray Test   Findings Positive    Side Right      Ambulation/Gait   Gait Comments no noticable deviations with gait. See running above.                       Objective measurements completed on examination: See above findings.       [facility] Adult PT Treatment/Exercise - 07/18/19 0001      Knee/Hip Exercises: Standing   Other Standing Knee Exercises cone drill x10     Other Standing Knee Exercises lateral band walk 2x10;       Knee/Hip Exercises: Supine   Bridges Limitations x10     Straight Leg Raises Limitations shown concentric up eccentric down                   PT Education - 07/18/19 0857    Education Details HEP and symptom management; proper squat mechanics    Person(s) Educated Patient    Methods Explanation;Demonstration;Tactile cues;Verbal cues;Handout    Comprehension Tactile cues required;Verbal cues required;Returned demonstration;Verbalized understanding            PT Short Term Goals - 07/18/19 1219      PT SHORT TERM GOAL #1   Title Patient will improve squat mechanics not letting knees go over his toes.    Time 3    Period Weeks    Status New    Target Date 08/08/19      PT SHORT TERM GOAL #2   Title Patient will be able to do single leg stance for >/= 30 seconds bilaterally to improve hip stability for proper running mechanics.    Time 3    Period Weeks    Status New    Target Date 08/08/19             PT Long Term Goals - 07/18/19 1204  PT LONG TERM GOAL #1   Title Patient will report decrease intensity and frequency of pain to </=1/10 when running 5-6 miles.    Time 6    Period Weeks    Target  Date 08/29/19      PT LONG TERM GOAL #2   Title Patient report he is able to bend down and play with daughter for 30 minutes with no knee pain.    Time 6    Period Weeks    Status New    Target Date 08/08/19      PT LONG TERM GOAL #3   Title Patient will present with a no/significantly less lateral whip when running and increase forward movement over the toe to decrease knee pain.    Time 6    Period Weeks    Status New    Target Date 08/29/19                  Plan - 07/18/19 0859    Clinical Impression Statement Patient is a 34 y.o. male who presents with R knee pain on the inferior and medial side of the knee. He presents with 5/5 strength. Patient exhibits forward lean during squat, knees going over his toes. Patient has a lateral whip bilaterally when running, most likely is contributing to his medial/inferior knee pain. Tenderness of the patellar tendon and pain during R step down suggests signs of patellar tendonitis. Therapy provided exercises for hip/glute strengthening and knee stablization. He would benefit from skilled therapy to improve ability to run withou pain.    Personal Factors and Comorbidities Past/Current Experience   medial meniscus repair   Examination-Activity Limitations Stairs;Other   running   Stability/Clinical Decision Making Stable/Uncomplicated    Clinical Decision Making Low    Rehab Potential Good    PT Frequency 1x / week   hold off for 2-3 weeks   PT Duration 6 weeks    PT Treatment/Interventions ADLs/Self Care Home Management;Gait training;Stair training;Therapeutic activities;Therapeutic exercise;Balance training;Neuromuscular re-education;Manual techniques;Patient/family education;Ultrasound;Iontophoresis 4mg /ml Dexamethasone;Dry needling;Taping;Cryotherapy    PT Next Visit Plan eccentric squat, Eccentric step down, monster walk, cable walk, assess tolerance to HEP, Modalities PRN. consder another running analyisis if symtoms have improved.  Consider dynamic strengthening, modalities PRN    PT Home Exercise Plan single leg bridge, straight leg raise, diver, lateral band walks Access Code: [email or web address]    Consulted and Agree with Plan of Care Patient           Patient will benefit from skilled therapeutic intervention in order to improve the following deficits and impairments:  Abnormal gait, Improper body mechanics, Pain, Decreased activity tolerance  Visit Diagnosis: Chronic pain of right knee  Other abnormalities of gait and mobility     Problem List There are no problems to display for this patient.   Carney Living PT DPT  07/18/2019, 2:34 PM  [facility] [address] [place], [place], [address] Phone: [phone]   Fax:  [phone]  Name: Justin Morrow MRN: [id number] Date of Birth: March 17, 1985

## 2019-07-19 ENCOUNTER — Ambulatory Visit (HOSPITAL_COMMUNITY): Payer: No Typology Code available for payment source

## 2019-07-21 ENCOUNTER — Ambulatory Visit: Payer: No Typology Code available for payment source | Admitting: Family Medicine

## 2019-07-24 ENCOUNTER — Encounter (INDEPENDENT_AMBULATORY_CARE_PROVIDER_SITE_OTHER): Payer: Self-pay | Admitting: Otolaryngology

## 2019-07-24 ENCOUNTER — Other Ambulatory Visit: Payer: Self-pay

## 2019-07-24 ENCOUNTER — Ambulatory Visit (INDEPENDENT_AMBULATORY_CARE_PROVIDER_SITE_OTHER): Payer: No Typology Code available for payment source | Admitting: Otolaryngology

## 2019-07-24 VITALS — Temp 97.5°F

## 2019-07-24 DIAGNOSIS — H6983 Other specified disorders of Eustachian tube, bilateral: Secondary | ICD-10-CM | POA: Diagnosis not present

## 2019-07-24 DIAGNOSIS — H7292 Unspecified perforation of tympanic membrane, left ear: Secondary | ICD-10-CM | POA: Diagnosis not present

## 2019-07-24 NOTE — Progress Notes (Signed)
HPI: Justin Morrow is a 34 y.o. male who presents is referred by Dr. Carlota Raspberry for evaluation of ears.  Patient apparently has just recently moved to the [place] area and had previously had history of chronic ear problems.  He has had a previous right tympanomastoidectomy performed in March 2016.  Prior to this he has had several instances of tubes placed in his ears.  Most recently the last tube was removed from his left ear in 2019.  He is doing well with his ears presently but wanted to get established with the ENT doctor since he has had so many ear problems in the past..  No past medical history on file. Past Surgical History:  Procedure Laterality Date  . KNEE ARTHROSCOPY  1999  . SPINE SURGERY  12/29/2005   Social History   Socioeconomic History  . Marital status: Married    Spouse name: Not on file  . Number of children: Not on file  . Years of education: Not on file  . Highest education level: Not on file  Occupational History  . Not on file  Tobacco Use  . Smoking status: Never Smoker  . Smokeless tobacco: Never Used  Vaping Use  . Vaping Use: Never used  Substance and Sexual Activity  . Alcohol use: Yes    Alcohol/week: 1.0 standard drink    Types: 1 Glasses of wine per week    Comment: once a month  . Drug use: Never  . Sexual activity: Yes  Other Topics Concern  . Not on file  Social History Narrative  . Not on file   Social Determinants of Health   Financial Resource Strain:   . Difficulty of Paying Living Expenses:   Food Insecurity:   . Worried About Charity fundraiser in the Last Year:   . Arboriculturist in the Last Year:   Transportation Needs:   . Film/video editor (Medical):   Marland Kitchen Lack of Transportation (Non-Medical):   Physical Activity:   . Days of Exercise per Week:   . Minutes of Exercise per Session:   Stress:   . Feeling of Stress :   Social Connections:   . Frequency of Communication with Friends and Family:   . Frequency of  Social Gatherings with Friends and Family:   . Attends Religious Services:   . Active Member of Clubs or Organizations:   . Attends Archivist Meetings:   Marland Kitchen Marital Status:    Family History  Problem Relation Age of Onset  . Cancer Mother   . Healthy Father   . Healthy Sister   . Healthy Brother   . Colon cancer Neg Hx   . Stomach cancer Neg Hx   . Pancreatic cancer Neg Hx   . Esophageal cancer Neg Hx    No Known Allergies Prior to Admission medications   Medication Sig Start Date End Date Taking? Authorizing Provider  hydrocortisone 1 % ointment Apply 1 application topically 2 (two) times daily.    Yes [provider]  Pyrithione Zinc (DHS ZINC) 2 % SHAM Apply topically.    Yes [provider]     Positive ROS: Otherwise negative  All other systems have been reviewed and were otherwise negative with the exception of those mentioned in the HPI and as above.  Physical Exam: Constitutional: Alert, well-appearing, no acute distress Ears: External ears without lesions or tenderness.  On microscopic exam he had minimal wax buildup that was cleaned in the  office today.  The left TM reveals a chronic small anterior inferior perforation from previous tube that was removed in 2019.  The middle ear space is clear with no active drainage.  The right ear canal is clear.  He had a previous right tympanomastoidectomy canal wall blocked.  The right TM is clear but retracted slightly in the posterior superior aspect.  But no middle ear effusion noted and no signs of infection or cholesteatoma. Nasal: External nose without lesions Clear nasal passages Oral: Lips and gums without lesions. Tongue and palate mucosa without lesions. Posterior oropharynx clear. Neck: No palpable adenopathy or masses Respiratory: Breathing comfortably  Skin: No facial/neck lesions or rash noted.  Procedures  Assessment: Chronic left TM perforation. Previous canal wall up right  tympanomastoidectomy.  Plan: Everything appears to be doing well presently.  I reviewed with him concerning the perforation in the left TM which is not causing any problems but would recommend keeping liquid out of the left ear. He will follow-up here if he has any ear problems as needed.   Radene Journey, MD   CC:

## 2019-07-31 ENCOUNTER — Ambulatory Visit (HOSPITAL_COMMUNITY)
Admission: RE | Admit: 2019-07-31 | Discharge: 2019-07-31 | Disposition: A | Discharge status: Home / Self Care | Payer: No Typology Code available for payment source | Source: Ambulatory Visit | Attending: Physician Assistant | Admitting: Physician Assistant

## 2019-07-31 ENCOUNTER — Other Ambulatory Visit: Payer: Self-pay

## 2019-07-31 DIAGNOSIS — R7989 Other specified abnormal findings of blood chemistry: Secondary | ICD-10-CM | POA: Insufficient documentation

## 2019-08-08 ENCOUNTER — Other Ambulatory Visit: Payer: Self-pay

## 2019-08-08 ENCOUNTER — Encounter: Payer: Self-pay | Admitting: Physical Therapy

## 2019-08-08 ENCOUNTER — Ambulatory Visit: Payer: No Typology Code available for payment source | Admitting: Physical Therapy

## 2019-08-08 DIAGNOSIS — M25561 Pain in right knee: Secondary | ICD-10-CM | POA: Diagnosis not present

## 2019-08-08 DIAGNOSIS — R2689 Other abnormalities of gait and mobility: Secondary | ICD-10-CM

## 2019-08-09 ENCOUNTER — Encounter: Payer: Self-pay | Admitting: Physical Therapy

## 2019-08-09 NOTE — Therapy (Signed)
[place], [place], [address] Phone: [phone]   Fax:  [phone]  Physical Therapy Treatment/Discharge   Patient Details  Name: Justin Morrow MRN: [id number] Date of Birth: February 20, 1985 Referring Provider (PT): Dr. Wendie Agreste    Encounter Date: 08/08/2019   PT End of Session - 08/08/19 1641    Visit Number 2    Number of Visits 6    Date for PT Re-Evaluation 08/29/19    PT Start Time 1632    PT Stop Time 1711    PT Time Calculation (min) 39 min    Activity Tolerance Patient tolerated treatment well    Behavior During Therapy [facility] for tasks assessed/performed           History reviewed. No pertinent past medical history.  Past Surgical History:  Procedure Laterality Date  . KNEE ARTHROSCOPY  1999  . SPINE SURGERY  12/29/2005    There were no vitals filed for this visit.   Subjective Assessment - 08/08/19 1720    Subjective Pt reports he is feeling much better. He is back to running 30 miles a week and only has pain occasionaly. He feels confident in his ability to continue his exercises.    Limitations Other (comment)    Diagnostic tests X ray: 1. No fracture or dislocation of the right knee. Joint spaces arepreserved. 2. Small incidental exostosis of the proximal right fibula, whichare occasionally symptomatic.    Patient Stated Goals decrease pain    Currently in Pain? Yes    Pain Score 4     Pain Location Knee    Pain Descriptors / Indicators Other (Comment)   sharp pain then turns to dull pain   Pain Type Chronic pain    Pain Radiating Towards down the leg into the calf    Pain Onset More than a month ago    Aggravating Factors  running intermittently    Pain Relieving Factors theraband exercises, stretching                             [facility] Adult PT Treatment/Exercise - 08/09/19 0001      Self-Care   Self-Care Other Self-Care Comments    Other Self-Care  Comments  reviewed POC and progression of exercises       Knee/Hip Exercises: Aerobic   Elliptical 5 minutes      Knee/Hip Exercises: Standing   Other Standing Knee Exercises single leg reach with 10 lb db x10 on R    Other Standing Knee Exercises monster walks x15 blue band; bounding x10 cuing to land quiet      Knee/Hip Exercises: Supine   Other Supine Knee/Hip Exercises thomas stretch x30 sec; hamstring stretch with strap 2x30 sec    Other Supine Knee/Hip Exercises dynamic stretching: forward lung with trunk extension x10; lateral lung x10 each way; forward kicks x10                   PT Education - 08/09/19 1309    Education Details updated HEP    Person(s) Educated Patient    Methods Explanation;Demonstration;Handout    Comprehension Verbalized understanding;Returned demonstration            PT Short Term Goals - 08/09/19 1310      PT SHORT TERM GOAL #1   Title Patient will improve squat mechanics not letting knees go over his toes.    Baseline  good technique    Time 3    Period Weeks    Status Achieved    Target Date 08/08/19      PT SHORT TERM GOAL #2   Title Patient will be able to do single leg stance for >/= 30 seconds bilaterally to improve hip stability for proper running mechanics.    Time 3    Period Weeks    Status Achieved    Target Date 08/08/19             PT Long Term Goals - 07/18/19 1204      PT LONG TERM GOAL #1   Title Patient will report decrease intensity and frequency of pain to </=1/10 when running 5-6 miles.    Time 6    Period Weeks    Target Date 08/29/19      PT LONG TERM GOAL #2   Title Patient report he is able to bend down and play with daughter for 30 minutes with no knee pain.    Time 6    Period Weeks    Status New    Target Date 08/08/19      PT LONG TERM GOAL #3   Title Patient will present with a no/significantly less lateral whip when running and increase forward movement over the toe to decrease knee  pain.    Time 6    Period Weeks    Status New    Target Date 08/29/19                 Plan - 08/08/19 1725    Clinical Impression Statement Patient returns stating he feels great and only occasionaly has minor pain. He has been doing his exercises at home and thinks they have helped decrease his pain. Therapy reviewed HEP and educated patient on dynamic stretching before running and managing his symptoms.Patient feels compfrtabel with his home program. He has had very little pain. D/C to HEP at this time.    Personal Factors and Comorbidities Past/Current Experience    Examination-Activity Limitations Stairs;Other    Stability/Clinical Decision Making Stable/Uncomplicated    Clinical Decision Making Low    Rehab Potential Good    PT Frequency 1x / week    PT Duration 6 weeks    PT Treatment/Interventions ADLs/Self Care Home Management;Gait training;Stair training;Therapeutic activities;Therapeutic exercise;Balance training;Neuromuscular re-education;Manual techniques;Patient/family education;Ultrasound;Iontophoresis 28m/ml Dexamethasone;Dry needling;Taping;Cryotherapy    PT Next Visit Plan D/C    PT Home Exercise Plan single leg bridge, straight leg raise, diver, lateral band walks Access Code: [email or web address]; eccentric step down, single leg reach, monster walks, dynamic stretching (forward lung, lateral lung, forward kick), hamstring stretch, thomas stretch    Consulted and Agree with Plan of Care Patient           Patient will benefit from skilled therapeutic intervention in order to improve the following deficits and impairments:  Abnormal gait, Improper body mechanics, Pain, Decreased activity tolerance  Visit Diagnosis: Other abnormalities of gait and mobility  Chronic pain of right knee  PHYSICAL THERAPY DISCHARGE SUMMARY  Visits from Start of Care: 2  Current functional level related to goals / functional outcomes: Mback to running with minimal pain    Remaining  deficits: Minimal pain at times   Education / Equipment: HEP Plan: Patient agrees to discharge.  Patient goals were met. Patient is being discharged due to not returning since the last visit.  ?????       Problem List There are no problems to  display for this patient.   Carney Living PT DPT  08/09/2019, 1:16 PM  [facility] [address] [place], [place], [address] Phone: [phone]   Fax:  [phone]  Name: Justin Morrow MRN: [id number] Date of Birth: September 08, 1985

## 2019-08-17 ENCOUNTER — Encounter: Payer: Self-pay | Admitting: Family Medicine

## 2019-08-21 ENCOUNTER — Ambulatory Visit: Payer: No Typology Code available for payment source | Admitting: Family Medicine

## 2019-09-21 ENCOUNTER — Ambulatory Visit: Payer: No Typology Code available for payment source | Admitting: Family Medicine

## 2019-09-22 ENCOUNTER — Telehealth: Payer: Self-pay | Admitting: Family Medicine

## 2019-09-22 NOTE — Telephone Encounter (Signed)
I left a vm on his answering machine letting him know I cancelled his CPE with Carlota Raspberry on 10/31/19. Please reschedule.

## 2019-10-31 ENCOUNTER — Encounter: Payer: 59 | Admitting: Family Medicine

## 2019-12-12 ENCOUNTER — Encounter: Payer: Self-pay | Admitting: Family Medicine

## 2019-12-13 ENCOUNTER — Ambulatory Visit: Payer: Self-pay | Admitting: *Deleted

## 2019-12-13 ENCOUNTER — Telehealth (INDEPENDENT_AMBULATORY_CARE_PROVIDER_SITE_OTHER): Payer: No Typology Code available for payment source | Admitting: Emergency Medicine

## 2019-12-13 ENCOUNTER — Encounter: Payer: Self-pay | Admitting: Emergency Medicine

## 2019-12-13 ENCOUNTER — Other Ambulatory Visit: Payer: Self-pay

## 2019-12-13 VITALS — Ht 67.0 in | Wt 157.5 lb

## 2019-12-13 DIAGNOSIS — R6889 Other general symptoms and signs: Secondary | ICD-10-CM | POA: Diagnosis not present

## 2019-12-13 DIAGNOSIS — R059 Cough, unspecified: Secondary | ICD-10-CM

## 2019-12-13 DIAGNOSIS — J22 Unspecified acute lower respiratory infection: Secondary | ICD-10-CM | POA: Diagnosis not present

## 2019-12-13 MED ORDER — AZITHROMYCIN 250 MG PO TABS: ORAL_TABLET | ORAL | 0 refills | Status: DC

## 2019-12-13 NOTE — Progress Notes (Addendum)
Telemedicine Encounter- SOAP NOTE Established Patient Patient: Home  Provider: Office     This telephone encounter was conducted with the patient's (or proxy's) verbal consent via audio telecommunications: yes/no: Yes Patient was instructed to have this encounter in a suitably private space; and to only have persons present to whom they give permission to participate. In addition, patient identity was confirmed by use of name plus two identifiers (DOB and address).  I discussed the limitations, risks, security and privacy concerns of performing an evaluation and management service by telephone and the availability of in person appointments. I also discussed with the patient that there may be a patient responsible charge related to this service. The patient expressed understanding and agreed to proceed.  I spent a total of TIME; 0 MIN TO 60 MIN: 20 minutes talking with the patient or their proxy.  Chief Complaint  Patient presents with  . Cough    per patient coughing yellow phelgm since November 20th  . Nasal Congestion    per patient has not been tested for Covid and got vaccine with booster    Subjective   Justin Morrow is a 34 y.o. male established patient. Telephone visit today complaining of flulike symptoms that started 1-1/2 weeks ago.  Still having cough with yellow phlegm and nasal congestion.  Wife and daughter also sick with similar symptoms.  They were both started on antibiotics by their primary care physicians.  They also tested negative for Covid.  Patient is fully vaccinated against Covid with a booster as well.  No other complaints or medical concerns today.  HPI   There are no problems to display for this patient.   No past medical history on file.  Current Outpatient Medications  Medication Sig Dispense Refill  . hydrocortisone 1 % ointment Apply 1 application topically 2 (two) times daily.     . Oxymetazoline HCl (AFRIN 12 HOUR NA) Place into the nose.      Marland Kitchen Pyrithione Zinc (DHS ZINC) 2 % SHAM Apply topically.      No current facility-administered medications for this visit.    No Known Allergies  Social History   Socioeconomic History  . Marital status: Married    Spouse name: Not on file  . Number of children: Not on file  . Years of education: Not on file  . Highest education level: Not on file  Occupational History  . Not on file  Tobacco Use  . Smoking status: Never Smoker  . Smokeless tobacco: Never Used  Vaping Use  . Vaping Use: Never used  Substance and Sexual Activity  . Alcohol use: Yes    Alcohol/week: 1.0 standard drink    Types: 1 Glasses of wine per week    Comment: once a month  . Drug use: Never  . Sexual activity: Yes  Other Topics Concern  . Not on file  Social History Narrative  . Not on file   Social Determinants of Health   Financial Resource Strain:   . Difficulty of Paying Living Expenses: Not on file  Food Insecurity:   . Worried About Charity fundraiser in the Last Year: Not on file  . Ran Out of Food in the Last Year: Not on file  Transportation Needs:   . Lack of Transportation (Medical): Not on file  . Lack of Transportation (Non-Medical): Not on file  Physical Activity:   . Days of Exercise per Week: Not on file  . Minutes of Exercise per  Session: Not on file  Stress:   . Feeling of Stress : Not on file  Social Connections:   . Frequency of Communication with Friends and Family: Not on file  . Frequency of Social Gatherings with Friends and Family: Not on file  . Attends Religious Services: Not on file  . Active Member of Clubs or Organizations: Not on file  . Attends Archivist Meetings: Not on file  . Marital Status: Not on file  Intimate Partner Violence:   . Fear of Current or Ex-Partner: Not on file  . Emotionally Abused: Not on file  . Physically Abused: Not on file  . Sexually Abused: Not on file    Review of Systems  Constitutional: Negative.  Negative  for chills and fever.  HENT: Positive for congestion. Negative for sore throat.   Respiratory: Positive for cough. Negative for shortness of breath.   Cardiovascular: Negative.  Negative for chest pain and palpitations.  Gastrointestinal: Negative.  Negative for abdominal pain, diarrhea, nausea and vomiting.  Genitourinary: Negative.  Negative for dysuria and hematuria.  Musculoskeletal: Negative.   Skin: Negative.  Negative for rash.  Neurological: Negative.  Negative for dizziness and headaches.  All other systems reviewed and are negative.   Objective  Alert and oriented x3 in no apparent respiratory distress Vitals as reported by the patient: Today's Vitals   12/13/19 1639  Weight: 157 lb 8 oz (71.4 kg)  Height: 5\' 7"  (1.702 m)    There are no diagnoses linked to this encounter. Damario was seen today for cough and nasal congestion.  Diagnoses and all orders for this visit:  Lower respiratory infection -     azithromycin (ZITHROMAX) 250 MG tablet; Sig as indicated  Cough  Flu-like symptoms     I discussed the assessment and treatment plan with the patient. The patient was provided an opportunity to ask questions and all were answered. The patient agreed with the plan and demonstrated an understanding of the instructions.   The patient was advised to call back or seek an in-person evaluation if the symptoms worsen or if the condition fails to improve as anticipated.  I provided 20 minutes of non-face-to-face time during this encounter.  Horald Pollen, MD  Primary Care at [facility]

## 2019-12-13 NOTE — Telephone Encounter (Signed)
Did not speak with pt.   Answered a question for the agent.

## 2019-12-13 NOTE — Patient Instructions (Signed)
° ° ° °  If you have lab work done today you will be contacted with your lab results within the next 2 weeks.  If you have not heard from us then please contact us. The fastest way to get your results is to register for My Chart. ° ° °IF you received an x-ray today, you will receive an invoice from [facility]. Please contact [facility] at [phone] with questions or concerns regarding your invoice.  ° °IF you received labwork today, you will receive an invoice from [facility]. Please contact [facility] at [phone] with questions or concerns regarding your invoice.  ° °Our billing staff will not be able to assist you with questions regarding bills from these companies. ° °You will be contacted with the lab results as soon as they are available. The fastest way to get your results is to activate your My Chart account. Instructions are located on the last page of this paperwork. If you have not heard from us regarding the results in 2 weeks, please contact this office. °  ° ° ° °

## 2020-01-11 ENCOUNTER — Encounter: Payer: No Typology Code available for payment source | Admitting: Family Medicine

## 2020-03-15 ENCOUNTER — Ambulatory Visit (INDEPENDENT_AMBULATORY_CARE_PROVIDER_SITE_OTHER): Payer: No Typology Code available for payment source | Admitting: Family Medicine

## 2020-03-15 ENCOUNTER — Encounter: Payer: Self-pay | Admitting: Family Medicine

## 2020-03-15 ENCOUNTER — Other Ambulatory Visit: Payer: Self-pay

## 2020-03-15 VITALS — BP 128/74 | HR 50 | Temp 98.0°F | Ht 66.0 in | Wt 160.6 lb

## 2020-03-15 DIAGNOSIS — Z8639 Personal history of other endocrine, nutritional and metabolic disease: Secondary | ICD-10-CM

## 2020-03-15 DIAGNOSIS — Z13220 Encounter for screening for lipoid disorders: Secondary | ICD-10-CM | POA: Diagnosis not present

## 2020-03-15 DIAGNOSIS — Z9889 Other specified postprocedural states: Secondary | ICD-10-CM

## 2020-03-15 DIAGNOSIS — Z0001 Encounter for general adult medical examination with abnormal findings: Secondary | ICD-10-CM | POA: Diagnosis not present

## 2020-03-15 DIAGNOSIS — Z0000 Encounter for general adult medical examination without abnormal findings: Secondary | ICD-10-CM

## 2020-03-15 DIAGNOSIS — R7989 Other specified abnormal findings of blood chemistry: Secondary | ICD-10-CM | POA: Diagnosis not present

## 2020-03-15 DIAGNOSIS — Z808 Family history of malignant neoplasm of other organs or systems: Secondary | ICD-10-CM

## 2020-03-15 NOTE — Patient Instructions (Addendum)
I will check blood work including liver test today, blood sugar, and did put information in your chart regarding continued follow-up with your specialists.  Follow-up in 1 year for physical unless there are concerns or questions sooner. Good luck at [employer].   Keeping you healthy  Get these tests  Blood pressure- Have your blood pressure checked once a year by your healthcare provider.  Normal blood pressure is 120/80.  Weight- Have your body mass index (BMI) calculated to screen for obesity.  BMI is a measure of body fat based on height and weight. You can also calculate your own BMI at [employer].  Cholesterol- Have your cholesterol checked regularly starting at age 51, sooner may be necessary if you have diabetes, high blood pressure, if a family member developed heart diseases at an early age or if you smoke.   Chlamydia, HIV, and other sexual transmitted disease- Get screened each year until the age of 78 then within three months of each new sexual partner.  Diabetes- Have your blood sugar checked regularly if you have high blood pressure, high cholesterol, a family history of diabetes or if you are overweight.  Get these vaccines  Flu shot- Every fall.  Tetanus shot- Every 10 years.  Menactra- Single dose; prevents meningitis.  Take these steps  Don't smoke- If you do smoke, ask your healthcare provider about quitting. For tips on how to quit, go to www.smokefree.gov or call 1-800-QUIT-NOW.  Be physically active- Exercise 5 days a week for at least 30 minutes.  If you are not already physically active start slow and gradually work up to 30 minutes of moderate physical activity.  Examples of moderate activity include walking briskly, mowing the yard, dancing, swimming bicycling, etc.  Eat a healthy diet- Eat a variety of healthy foods such as fruits, vegetables, low fat milk, low fat cheese, yogurt, lean meats, poultry, fish, beans, tofu, etc.  For more  information on healthy eating, go to www.thenutritionsource.org  Drink alcohol in moderation- Limit alcohol intake two drinks or less a day.  Never drink and drive.  Dentist- Brush and floss teeth twice daily; visit your dentis twice a year.  Depression-Your emotional health is as important as your physical health.  If you're feeling down, losing interest in things you normally enjoy please talk with your healthcare provider.  Gun Safety- If you keep a gun in your home, keep it unloaded and with the safety lock on.  Bullets should be stored separately.  Helmet use- Always wear a helmet when riding a motorcycle, bicycle, rollerblading or skateboarding.  Safe sex- If you may be exposed to a sexually transmitted infection, use a condom  Seat belts- Seat bels can save your life; always wear one.  Smoke/Carbon Monoxide detectors- These detectors need to be installed on the appropriate level of your home.  Replace batteries at least once a year.  Skin Cancer- When out in the sun, cover up and use sunscreen SPF 15 or higher.  Violence- If anyone is threatening or hurting you, please tell your healthcare provider.   If you have lab work done today you will be contacted with your lab results within the next 2 weeks.  If you have not heard from [place] then please contact us. The fastest way to get your results is to register for My Chart.   IF you received an x-ray today, you will receive an invoice from [facility]. Please contact [facility] at [phone] with questions or concerns regarding your invoice.  IF you received labwork today, you will receive an invoice from [facility]. Please contact [facility] at [phone] with questions or concerns regarding your invoice.   Our billing staff will not be able to assist you with questions regarding bills from these companies.  You will be contacted with the lab results as soon as they are available. The fastest way to get your  results is to activate your My Chart account. Instructions are located on the last page of this paperwork. If you have not heard from [place] regarding the results in 2 weeks, please contact this office.

## 2020-03-15 NOTE — Progress Notes (Signed)
Subjective:  Patient ID: Justin Morrow, male    DOB: [birth year]  Age: 35 y.o. MRN: [id number]  CC:  Chief Complaint  Patient presents with  . 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  Daughter is 2 and 1/2. Wife at home with [facility].   No new health issues.   History of elevated LFTs Discussed at previous visits.  Has had previous work-up including hepatitis testing, ultrasound, CT scans, contrast studies and seen by specialist previously without known cause.  Has reported in the past being overweight, but with weight loss some improved readings.  Plan for continued monitoring but was referred to gastroenterologist locally.  Most recent LFTs were normal in June 2021 with AST 24, ALT 40.  Previous ALT has been in the 52-57 range with normal AST since 2020.  GGT has remained elevated at 265 9 months ago, previously 198. Evaluated by [facility] gastroenterology July 11, 2019.  Assigned to Dr. Havery Moros, was seen by Ellouise Newer, PA-C.  Various labs obtained at that visit.  Liver ultrasound/right upper quadrant ultrasound on 07/31/2019 without focal lesion identified, within normal limits in the parenchymal echogenicity.  Negative testing.  Plan for monitoring LFTs every 6 months. No regular alcohol - every few months, no tylenol.   History of tympanostomy tubes, prior ENT surgeries.  Referred to local ENT. Dr. Lucia Gaskins. Ongoing follow up recommended - appt in June.   Also referred to dermatology last year for routine skin cancer screening. Dr. Wilhemina Bonito.   Referred to physical therapy last June for right knee pain, prior meniscus surgery at 35 years old, plan for quad strengthening for possible patellofemoral versus medial meniscal disease.  Treated at outpatient rehab July 27. Did well. No  further issues.   History of hyperglycemia Glucose 101 in October 2020, but normal glucose 88 in June of last year.  Hyperlipidemia: Mild elevated LDL previously, no current meds. Has improved diet - more plant based diet.  Lab Results  Component Value Date   CHOL 190 06/16/2019   HDL 46 06/16/2019   LDLCALC 118 (H) 06/16/2019   TRIG 147 06/16/2019   CHOLHDL 4.1 06/16/2019   Lab Results  Component Value Date   ALT 40 07/11/2019   AST 24 07/11/2019   GGT 265 (H) 06/16/2019   ALKPHOS 60 07/11/2019   BILITOT 1.0 07/11/2019    Immunization History  Administered Date(s) Administered  . Influenza-Unspecified 10/13/2019  . Tdap 06/16/2019  COVID vaccine Moderna: 01/30/19, 02/27/19, booster 11/25/19.    Depression screen [facility] 2/9 12/13/2019 06/16/2019 10/28/2018  Decreased Interest 0 0 0  Down, Depressed, Hopeless 0 0 0  PHQ - 2 Score 0 0 0     Hearing Screening   125Hz  250Hz  500Hz  1000Hz  2000Hz  3000Hz  4000Hz  6000Hz  8000Hz   Right ear:           Left ear:             Visual Acuity Screening   Right eye Left eye Both eyes  Without correction:     With correction: 20/40 20/40 20/30-1  optho - due for appt in April. CL and glasses.   Dental: has been followed every 6 months.   Exercise: 4-5 days per week.     History There are no problems to  display for this patient.  No past medical history on file. Past Surgical History:  Procedure Laterality Date  . KNEE ARTHROSCOPY  1999  . SPINE SURGERY  12/29/2005   No Known Allergies Prior to Admission medications   Medication Sig Start Date End Date Taking? Authorizing Provider  Oxymetazoline HCl (AFRIN 12 HOUR NA) Place into the nose.    [provider]   Social History   Socioeconomic History  . Marital status: Married    Spouse name: Not on file  . Number of children: Not on file  . Years of education: Not on file  . Highest education level: Not on file  Occupational History  . Not on file  Tobacco Use   . Smoking status: Never Smoker  . Smokeless tobacco: Never Used  Vaping Use  . Vaping Use: Never used  Substance and Sexual Activity  . Alcohol use: Yes    Alcohol/week: 1.0 standard drink    Types: 1 Glasses of wine per week    Comment: once a month  . Drug use: Never  . Sexual activity: Yes  Other Topics Concern  . Not on file  Social History Narrative  . Not on file   Social Determinants of Health   Financial Resource Strain: Not on file  Food Insecurity: Not on file  Transportation Needs: Not on file  Physical Activity: Not on file  Stress: Not on file  Social Connections: Not on file  Intimate Partner Violence: Not on file    Review of Systems  13 point review of systems per patient health survey noted.  Negative other than as indicated above or in HPI.   Objective:   Vitals:   03/15/20 0850  BP: 128/74  Pulse: (!) 50  Temp: 98 F (36.7 C)  TempSrc: Temporal  SpO2: 99%  Weight: 160 lb 9.6 oz (72.8 kg)  Height: 5\' 6"  (1.676 m)     Physical Exam Vitals reviewed.  Constitutional:      Appearance: He is well-developed.  HENT:     Head: Normocephalic and atraumatic.     Right Ear: External ear normal.     Left Ear: External ear normal.     Ears:     Comments: Scarring/irregularity of the TM bilaterally.  Canal clear. Eyes:     Conjunctiva/sclera: Conjunctivae normal.     Pupils: Pupils are equal, round, and reactive to light.  Neck:     Thyroid: No thyromegaly.  Cardiovascular:     Rate and Rhythm: Normal rate and regular rhythm.     Heart sounds: Normal heart sounds.  Pulmonary:     Effort: Pulmonary effort is normal. No respiratory distress.     Breath sounds: Normal breath sounds. No wheezing.  Abdominal:     General: There is no distension.     Palpations: Abdomen is soft. There is no mass.     Tenderness: There is no abdominal tenderness. There is no guarding.     Hernia: There is no hernia in the left inguinal area or right inguinal area.   Genitourinary:    Prostate: Normal.  Musculoskeletal:        General: No tenderness. Normal range of motion.     Cervical back: Normal range of motion and neck supple.  Lymphadenopathy:     Cervical: No cervical adenopathy.  Skin:    General: Skin is warm and dry.  Neurological:     Mental Status: He is alert and oriented to person, place, and  time.     Deep Tendon Reflexes: Reflexes are normal and symmetric.  Psychiatric:        Mood and Affect: Mood normal.        Behavior: Behavior normal.        Thought Content: Thought content normal.        Assessment & Plan:  Fardeen Steinberger is a 35 y.o. male . Annual physical exam  Screening for hyperlipidemia - Plan: Comprehensive metabolic panel, Lipid panel  Elevated LFTs - Plan: Comprehensive metabolic panel  History of tympanomastoidectomy  Family history of malignant melanoma  History of hyperglycemia - Plan: Hemoglobin A1c  -anticipatory guidance as below in AVS, screening labs above. Health maintenance items as above in HPI discussed/recommended as applicable.   -Recommend continued follow-up with gastroenterology regarding previous LFTs, will check levels today.  Option of lab only visit for LFTs within a day or 2 after alcoholic beverage if there is a concern that that may cause increase.  Infrequent use.  -Continue follow-up with ear nose and throat specialist given previous tympanostomy tubes and ear issues.  -Previous borderline hyperglycemia, hyperlipidemia.  Check labs as above.  Has improved diet.  -Continue follow-up with dermatology with history of skin cancer in the family.  1 year follow-up for physical. No orders of the defined types were placed in this encounter.  Patient Instructions    I will check blood work including liver test today, blood sugar, and did put information in your chart regarding continued follow-up with your specialists.  Follow-up in 1 year for physical unless there are concerns  or questions sooner. Good luck at [employer].   Keeping you healthy  Get these tests  Blood pressure- Have your blood pressure checked once a year by your healthcare provider.  Normal blood pressure is 120/80.  Weight- Have your body mass index (BMI) calculated to screen for obesity.  BMI is a measure of body fat based on height and weight. You can also calculate your own BMI at GravelBags.it.  Cholesterol- Have your cholesterol checked regularly starting at age 58, sooner may be necessary if you have diabetes, high blood pressure, if a family member developed heart diseases at an early age or if you smoke.   Chlamydia, HIV, and other sexual transmitted disease- Get screened each year until the age of 37 then within three months of each new sexual partner.  Diabetes- Have your blood sugar checked regularly if you have high blood pressure, high cholesterol, a family history of diabetes or if you are overweight.  Get these vaccines  Flu shot- Every fall.  Tetanus shot- Every 10 years.  Menactra- Single dose; prevents meningitis.  Take these steps  Don't smoke- If you do smoke, ask your healthcare provider about quitting. For tips on how to quit, go to www.smokefree.gov or call 1-800-QUIT-NOW.  Be physically active- Exercise 5 days a week for at least 30 minutes.  If you are not already physically active start slow and gradually work up to 30 minutes of moderate physical activity.  Examples of moderate activity include walking briskly, mowing the yard, dancing, swimming bicycling, etc.  Eat a healthy diet- Eat a variety of healthy foods such as fruits, vegetables, low fat milk, low fat cheese, yogurt, lean meats, poultry, fish, beans, tofu, etc.  For more information on healthy eating, go to www.thenutritionsource.org  Drink alcohol in moderation- Limit alcohol intake two drinks or less a day.  Never drink and drive.  Dentist- Brush and floss teeth twice  daily; visit your  dentis twice a year.  Depression-Your emotional health is as important as your physical health.  If you're feeling down, losing interest in things you normally enjoy please talk with your healthcare provider.  Gun Safety- If you keep a gun in your home, keep it unloaded and with the safety lock on.  Bullets should be stored separately.  Helmet use- Always wear a helmet when riding a motorcycle, bicycle, rollerblading or skateboarding.  Safe sex- If you may be exposed to a sexually transmitted infection, use a condom  Seat belts- Seat bels can save your life; always wear one.  Smoke/Carbon Monoxide detectors- These detectors need to be installed on the appropriate level of your home.  Replace batteries at least once a year.  Skin Cancer- When out in the sun, cover up and use sunscreen SPF 15 or higher.  Violence- If anyone is threatening or hurting you, please tell your healthcare provider.   If you have lab work done today you will be contacted with your lab results within the next 2 weeks.  If you have not heard from [place] then please contact us. The fastest way to get your results is to register for My Chart.   IF you received an x-ray today, you will receive an invoice from [facility]. Please contact [facility] at [phone] with questions or concerns regarding your invoice.   IF you received labwork today, you will receive an invoice from [facility]. Please contact [facility] at [phone] with questions or concerns regarding your invoice.   Our billing staff will not be able to assist you with questions regarding bills from these companies.  You will be contacted with the lab results as soon as they are available. The fastest way to get your results is to activate your My Chart account. Instructions are located on the last page of this paperwork. If you have not heard from [place] regarding the results in 2 weeks, please contact this office.           Signed, Merri Ray, MD [facility]

## 2020-03-16 LAB — COMPREHENSIVE METABOLIC PANEL
ALT: 48 IU/L — ABNORMAL HIGH (ref 0–44)
AST: 30 IU/L (ref 0–40)
Albumin/Globulin Ratio: 2 (ref 1.2–2.2)
Albumin: 5 g/dL (ref 4.0–5.0)
Alkaline Phosphatase: 90 IU/L (ref 44–121)
BUN/Creatinine Ratio: 11 (ref 9–20)
BUN: 10 mg/dL (ref 6–20)
Bilirubin Total: 0.6 mg/dL (ref 0.0–1.2)
CO2: 22 mmol/L (ref 20–29)
Calcium: 10.1 mg/dL (ref 8.7–10.2)
Chloride: 106 mmol/L (ref 96–106)
Creatinine, Ser: 0.9 mg/dL (ref 0.76–1.27)
Globulin, Total: 2.5 g/dL (ref 1.5–4.5)
Glucose: 92 mg/dL (ref 65–99)
Potassium: 5 mmol/L (ref 3.5–5.2)
Sodium: 144 mmol/L (ref 134–144)
Total Protein: 7.5 g/dL (ref 6.0–8.5)
eGFR: 114 mL/min/{1.73_m2} (ref >59)

## 2020-03-16 LAB — LIPID PANEL
Chol/HDL Ratio: 3.6 ratio (ref 0.0–5.0)
Cholesterol, Total: 200 mg/dL — ABNORMAL HIGH (ref 100–199)
HDL: 55 mg/dL (ref >39)
LDL Chol Calc (NIH): 129 mg/dL — ABNORMAL HIGH (ref 0–99)
Triglycerides: 86 mg/dL (ref 0–149)
VLDL Cholesterol Cal: 16 mg/dL (ref 5–40)

## 2020-03-16 LAB — HEMOGLOBIN A1C
Est. average glucose Bld gHb Est-mCnc: 117 mg/dL
Hgb A1c MFr Bld: 5.7 % — ABNORMAL HIGH (ref 4.8–5.6)

## 2020-03-27 ENCOUNTER — Encounter: Payer: Self-pay | Admitting: Family Medicine

## 2020-03-27 DIAGNOSIS — E785 Hyperlipidemia, unspecified: Secondary | ICD-10-CM

## 2020-04-09 ENCOUNTER — Encounter: Payer: Self-pay | Admitting: Family Medicine

## 2020-04-24 ENCOUNTER — Encounter (HOSPITAL_BASED_OUTPATIENT_CLINIC_OR_DEPARTMENT_OTHER): Payer: Self-pay | Admitting: Family Medicine

## 2020-04-24 ENCOUNTER — Other Ambulatory Visit: Payer: Self-pay

## 2020-04-24 ENCOUNTER — Ambulatory Visit (INDEPENDENT_AMBULATORY_CARE_PROVIDER_SITE_OTHER): Payer: No Typology Code available for payment source | Admitting: Family Medicine

## 2020-04-24 DIAGNOSIS — R7303 Prediabetes: Secondary | ICD-10-CM

## 2020-04-24 DIAGNOSIS — R7401 Elevation of levels of liver transaminase levels: Secondary | ICD-10-CM

## 2020-04-24 DIAGNOSIS — S86819A Strain of other muscle(s) and tendon(s) at lower leg level, unspecified leg, initial encounter: Secondary | ICD-10-CM | POA: Insufficient documentation

## 2020-04-24 DIAGNOSIS — M25559 Pain in unspecified hip: Secondary | ICD-10-CM | POA: Insufficient documentation

## 2020-04-24 DIAGNOSIS — M545 Low back pain, unspecified: Secondary | ICD-10-CM | POA: Insufficient documentation

## 2020-04-24 DIAGNOSIS — G8929 Other chronic pain: Secondary | ICD-10-CM

## 2020-04-24 DIAGNOSIS — S86811A Strain of other muscle(s) and tendon(s) at lower leg level, right leg, initial encounter: Secondary | ICD-10-CM

## 2020-04-24 DIAGNOSIS — E785 Hyperlipidemia, unspecified: Secondary | ICD-10-CM

## 2020-04-24 NOTE — Assessment & Plan Note (Signed)
Has had chronic intermittent issues No red flags on history or exam today Will refer to physical therapy for further evaluation and management, recommendation of home exercises

## 2020-04-24 NOTE — Progress Notes (Signed)
New Patient Office Visit  Subjective:  Patient ID: Justin Morrow, male    DOB: 01/06/1986  Age: 35 y.o. MRN: [id number]  CC:  Chief Complaint  Patient presents with  . Establish Care  . Muscle Pain    Muscle strains in the lower back, b/l hip favoring the left over the right, and left calf. Pt is an avid runner    HPI Justin Morrow is a 35 year old male presenting to establish in clinic.  He has current concerns related to suspected strain of right calf a few months ago as well as intermittent low back pain and bilateral hip pain.  Past medical history significant for prediabetes, hyperlipidemia, transaminitis.  Calf strain: Patient is an avid runner and noted back in January 2022 that he felt pain in his right calf and believes that he strained it.  Reports he took some time off of running but still notes some discomfort on beginning to run again.  Back and hip pain: Has noted intermittent issues with back and hip pain.  Reports history of bulging disc in his lumbar spine about 5 to 6 years ago for which he saw a Restaurant manager, fast food and osteopathic physician.  Did have improvement in his symptoms after working with osteopathic physician.  Will occasionally have bilateral hip pain, tends to be worse on the left side.  Presently doing well with this.  Prediabetes: Slightly elevated hemoglobin A1c at 5.7% was found on most recent labs with previous PCP.  Does have some questions about this today regarding significance and how to best manage moving forward.  Hyperlipidemia: Has had slight elevation in total cholesterol and LDL observed on recent labs.  Managing with lifestyle modifications, pharmacotherapy not necessarily indicated thus far given low level of elevation and lack of significant comorbidities.  Transaminitis: Was found incidentally during screening for life insurance a few years ago.  Work-up with GI in [place] and now here with [facility] has been largely unremarkable with  normal follow-up lab work and imaging.  Recommendation from GI has been to annually or semiannually check LFTs.  History reviewed. No pertinent past medical history.  Past Surgical History:  Procedure Laterality Date  . KNEE ARTHROSCOPY  1999  . SPINE SURGERY  12/29/2005    Family History  Problem Relation Age of Onset  . Cancer Mother   . Hyperlipidemia Father   . Hypertension Father   . Healthy Sister   . Healthy Brother   . Dementia Maternal Grandmother   . Alzheimer's disease Maternal Grandmother   . Cancer Maternal Grandfather   . Diabetes Paternal Grandmother   . Heart disease Paternal Grandfather   . Colon cancer Neg Hx   . Stomach cancer Neg Hx   . Pancreatic cancer Neg Hx   . Esophageal cancer Neg Hx     Social History   Socioeconomic History  . Marital status: Married    Spouse name: Not on file  . Number of children: Not on file  . Years of education: Not on file  . Highest education level: Not on file  Occupational History  . Not on file  Tobacco Use  . Smoking status: Never Smoker  . Smokeless tobacco: Never Used  Vaping Use  . Vaping Use: Never used  Substance and Sexual Activity  . Alcohol use: Yes    Alcohol/week: 1.0 standard drink    Types: 1 Glasses of wine per week    Comment: rarely, every 2-3 months  . Drug use: Never  .  Sexual activity: Yes  Other Topics Concern  . Not on file  Social History Narrative  . Not on file   Social Determinants of Health   Financial Resource Strain: Not on file  Food Insecurity: Not on file  Transportation Needs: Not on file  Physical Activity: Not on file  Stress: Not on file  Social Connections: Not on file  Intimate Partner Violence: Not on file    Objective:   Today's Vitals: BP 122/74   Pulse 64   Ht 5\' 6"  (1.676 m)   Wt 156 lb 12.8 oz (71.1 kg)   SpO2 98%   BMI 25.31 kg/m   Physical Exam  Pleasant 35 year old male in no acute distress Cardiovascular exam with regular rate and  rhythm, no murmurs appreciated Lungs clear to auscultation bilaterally Lumbar spine exam with no tenderness palpation along spinous processes or through paraspinal musculature Normal range of motion at bilateral hips with no pain elicited on internal and external rotation, negative FABER and FADIR, no tenderness to palpation through hip abductors or over greater trochanters.  Assessment & Plan:   Problem List Items Addressed This Visit      Musculoskeletal and Integument   Strain of calf muscle    Initial injury about 3 months ago Continued to have some problems on resuming physical activity Will refer to physical therapy for further evaluation and treatment      Relevant Orders   Ambulatory referral to Physical Therapy     Other   Low back pain    Has had chronic intermittent issues No red flags on history or exam today Will refer to physical therapy for further evaluation and management, recommendation of home exercises      Relevant Orders   Ambulatory referral to Physical Therapy   Hip pain    Intermittent issues with bilateral hips, left typically worse than right Will refer to physical therapy for further evaluation management, treatment modalities as per PT      Relevant Orders   Ambulatory referral to Physical Therapy   Prediabetes    Labs completed last month with prior PCP revealing hemoglobin A1c of 5.7% Discussed recommended management including lifestyle modifications which patient has been working on Provided handout on glycemic index for foods, recommend working limit high glycemic index foods in the diet Discussed option of starting Metformin to lower risk of progression to diabetes, will hold off for now Patient plans to meet with dietitian Will recheck A1c prior to next appointment in about 6 months      Relevant Orders   Hemoglobin A1c   Comprehensive metabolic panel   Hyperlipidemia    Slight elevation in total cholesterol and LDL on most recent  labs Discussed lifestyle modifications as above Will repeat labs prior to next appointment      Relevant Orders   Lipid panel   Comprehensive metabolic panel   Transaminitis    History of slightly elevated LFTs Has undergone thorough evaluation with GI in [place] as well as GI physician with [place] in 2021 -both work-ups were largely unremarkable Most recent LFTs have been normal or only slightly elevated recommendation from GI was continued intermittent monitoring.  Consideration for liver biopsy although with elevation is being relatively minimal, was not strongly advised at this time      Relevant Orders   Comprehensive metabolic panel      Outpatient Encounter Medications as of 04/24/2020  Medication Sig  . Oxymetazoline HCl (AFRIN 12 HOUR NA) Place into the  nose.   No facility-administered encounter medications on file as of 04/24/2020.   Spent 60 minutes on this patient encounter, including preparation, chart review, face-to-face counseling with patient and coordination of care, and documentation of encounter  Follow-up: Return in about 6 months (around 10/24/2020) for Follow Up.   Vanilla Heatherington J De Guam, MD

## 2020-04-24 NOTE — Assessment & Plan Note (Addendum)
History of slightly elevated LFTs Reviewed notes and labs completed with gastroenterology in the past Has undergone thorough evaluation with GI in [place] as well as GI physician with [place] in 2021 -both work-ups were largely unremarkable Most recent LFTs have been normal or only slightly elevated recommendation from GI was continued intermittent monitoring.  Consideration for liver biopsy although with elevation is being relatively minimal, was not strongly advised at this time

## 2020-04-24 NOTE — Assessment & Plan Note (Signed)
Intermittent issues with bilateral hips, left typically worse than right Will refer to physical therapy for further evaluation management, treatment modalities as per PT

## 2020-04-24 NOTE — Assessment & Plan Note (Signed)
Labs completed last month with prior PCP revealing hemoglobin A1c of 5.7% Discussed recommended management including lifestyle modifications which patient has been working on Provided handout on glycemic index for foods, recommend working limit high glycemic index foods in the diet Discussed option of starting Metformin to lower risk of progression to diabetes, will hold off for now Patient plans to meet with dietitian Will recheck A1c prior to next appointment in about 6 months

## 2020-04-24 NOTE — Assessment & Plan Note (Signed)
Initial injury about 3 months ago Continued to have some problems on resuming physical activity Will refer to physical therapy for further evaluation and treatment

## 2020-04-24 NOTE — Assessment & Plan Note (Signed)
Slight elevation in total cholesterol and LDL on most recent labs Discussed lifestyle modifications as above Will repeat labs prior to next appointment

## 2020-04-24 NOTE — Patient Instructions (Addendum)
  Medication Instructions:  Your physician recommends that you continue on your current medications as directed. Please refer to the Current Medication list given to you today. --If you need a refill on any your medications before your next appointment, please call your pharmacy first. If no refills are authorized on file call the office.--  Lab Work: Your physician has recommended that you have lab work: A1C, Lipid Profile, Comprehensive Metabolic Panel (prior to follow up appointment) If you have labs (blood work) drawn today and your tests are completely normal, you will receive your results only by: Marland Kitchen MyChart Message (if you have MyChart) OR . A phone call from our staff. Please ensure you check your voicemail in the event that you authorized detailed messages to be left on a delegated number. If you have any lab test that is abnormal or we need to change your treatment, we will call you to review the results.  Referrals/Procedures/Imaging: A referral has been placed for you to Physical Therapy at [facility]. Someone from the scheduling department will be in contact with you in regards to coordinating your consultation. If you do not hear from any of the schedulers within 7-10 business days please give our office a call.  Outpatient Rehab Services - 1st Floor Monday - Friday 8:00 am - 5:00 pm Closed on Weekends and major holidays [phone]  Follow-Up: Your next appointment:   Your physician recommends that you schedule a follow-up appointment in: 6-8 MONTHS with Dr. De [place]am  Thanks for letting us be apart of your health journey!!  Primary Care and Sports Medicine   Dr. de [place]am and Worthy Keeler, DNP, AGNP  We recommend signing up for the patient portal called "MyChart".  Sign up information is provided on this After Visit Summary.  MyChart is used to connect with patients for Virtual Visits (Telemedicine).  Patients are able to view lab/test  results, encounter notes, upcoming appointments, etc.  Non-urgent messages can be sent to your provider as well.   To learn more about what you can do with MyChart, go to [email or web address].

## 2020-05-14 ENCOUNTER — Ambulatory Visit: Payer: No Typology Code available for payment source | Admitting: Dietician

## 2020-05-22 ENCOUNTER — Encounter (HOSPITAL_BASED_OUTPATIENT_CLINIC_OR_DEPARTMENT_OTHER): Payer: Self-pay | Admitting: Family Medicine

## 2020-05-22 ENCOUNTER — Ambulatory Visit (INDEPENDENT_AMBULATORY_CARE_PROVIDER_SITE_OTHER): Payer: No Typology Code available for payment source | Admitting: Family Medicine

## 2020-05-22 ENCOUNTER — Other Ambulatory Visit: Payer: Self-pay

## 2020-05-22 DIAGNOSIS — M5417 Radiculopathy, lumbosacral region: Secondary | ICD-10-CM

## 2020-05-22 MED ORDER — MELOXICAM 15 MG PO TABS: 15.0000 mg | ORAL_TABLET | Freq: Every day | ORAL | 1 refills | Status: DC

## 2020-05-22 MED ORDER — GABAPENTIN 300 MG PO CAPS: 300.0000 mg | ORAL_CAPSULE | Freq: Three times a day (TID) | ORAL | 0 refills | Status: DC

## 2020-05-22 NOTE — Patient Instructions (Signed)
  Medication Instructions:  Your physician has recommended you make the following change in your medication:  --START Meloxicam 15 mg - Take one 1 tablet (15 mg) by mouth once daily -- RX SENT -- START Gabapentin 300 mg - Take 1 tablet (300 mg) by mouth three times daily for  14 days -- RX SENT --If you need a refill on any your medications before your next appointment, please call your pharmacy first. If no refills are authorized on file call the office.--  Follow-Up: Your next appointment:   Your physician recommends that you schedule a follow-up appointment in: 3-4 WEEK with Dr. de [place]am  Thanks for letting us be apart of your health journey!!  Primary Care and Sports Medicine   Dr. de [place]am and Worthy Keeler, DNP, AGNP  We recommend signing up for the patient portal called "MyChart".  Sign up information is provided on this After Visit Summary.  MyChart is used to connect with patients for Virtual Visits (Telemedicine).  Patients are able to view lab/test results, encounter notes, upcoming appointments, etc.  Non-urgent messages can be sent to your provider as well.   To learn more about what you can do with MyChart, please visit --  [email or web address].

## 2020-05-23 ENCOUNTER — Ambulatory Visit (HOSPITAL_BASED_OUTPATIENT_CLINIC_OR_DEPARTMENT_OTHER)
Admission: RE | Admit: 2020-05-23 | Discharge: 2020-05-23 | Disposition: A | Discharge status: Home / Self Care | Payer: No Typology Code available for payment source | Source: Ambulatory Visit | Attending: Family Medicine | Admitting: Family Medicine

## 2020-05-23 ENCOUNTER — Telehealth (HOSPITAL_BASED_OUTPATIENT_CLINIC_OR_DEPARTMENT_OTHER): Payer: Self-pay

## 2020-05-23 ENCOUNTER — Encounter (HOSPITAL_BASED_OUTPATIENT_CLINIC_OR_DEPARTMENT_OTHER): Payer: Self-pay | Admitting: Physical Therapy

## 2020-05-23 ENCOUNTER — Ambulatory Visit (HOSPITAL_BASED_OUTPATIENT_CLINIC_OR_DEPARTMENT_OTHER): Payer: No Typology Code available for payment source | Attending: Family Medicine | Admitting: Physical Therapy

## 2020-05-23 ENCOUNTER — Other Ambulatory Visit (HOSPITAL_BASED_OUTPATIENT_CLINIC_OR_DEPARTMENT_OTHER): Payer: Self-pay | Admitting: Family Medicine

## 2020-05-23 DIAGNOSIS — M25551 Pain in right hip: Secondary | ICD-10-CM | POA: Insufficient documentation

## 2020-05-23 DIAGNOSIS — R262 Difficulty in walking, not elsewhere classified: Secondary | ICD-10-CM | POA: Insufficient documentation

## 2020-05-23 DIAGNOSIS — M545 Low back pain, unspecified: Secondary | ICD-10-CM | POA: Insufficient documentation

## 2020-05-23 DIAGNOSIS — M5417 Radiculopathy, lumbosacral region: Secondary | ICD-10-CM

## 2020-05-23 DIAGNOSIS — R2689 Other abnormalities of gait and mobility: Secondary | ICD-10-CM | POA: Diagnosis present

## 2020-05-23 NOTE — Telephone Encounter (Signed)
Patient called and left a voicemail stating that his symptoms worsened overnight Upon waking this morning patient states he not able to walk further than 5 ft without excruciating pain Patient is requesting some form of a spinal scan to further investigate the root cause of his pain Will forward to Dr. de Guam for advisement

## 2020-05-23 NOTE — Assessment & Plan Note (Addendum)
Symptoms seem most consistent with lumbosacral radiculopathy -at present patient currently has moderate pain without obvious focal weakness Recommend initial treatment with NSAID and consideration of gabapentin - prescriptions sent to pharmacy Recommend proceeding with physical therapy, patient has appointment arranged for next week Plan for close follow-up about 2 to 3 weeks, if not improving as expected, consider advanced imaging

## 2020-05-23 NOTE — Therapy (Signed)
[facility] [address] [place], [place], [address] Phone: [phone]   Fax:  [phone]  Physical Therapy Evaluation  Patient Details  Name: Justin Morrow MRN: [id number] Date of Birth: November 21, 1985 Referring Provider (PT): de [place]am, Blondell Reveal, MD   Encounter Date: 05/23/2020   PT End of Session - 05/23/20 1517    Visit Number 1    Number of Visits 9    Date for PT Re-Evaluation 06/21/20    Authorization Type [facility]    PT Start Time 1230    PT Stop Time 1320    PT Time Calculation (min) 50 min    Activity Tolerance Patient tolerated treatment well    Behavior During Therapy [facility] for tasks assessed/performed           History reviewed. No pertinent past medical history.  Past Surgical History:  Procedure Laterality Date  . KNEE ARTHROSCOPY  1999  . SPINE SURGERY  12/29/2005    There were no vitals filed for this visit.    Subjective Assessment - 05/23/20 1231    Subjective In Jan, I developed a bunch of pain of insidious onset. started with calf strain, took a break but spread from there. Did some exercises for Lt hip flexor that helped. I was sitting in a soft chair 2-3 wk ago and began feeling pain in Rt buttock. Sunday AM felt a shift on Rt side when getting out of bed. Mon into tues felt no pain in AM, as day goes on gets worse down Rt leg and to ankle. Today I can only walk 5-6 ft- numbing/burning in ankle and more shooting down Rt leg on post aspect.    Currently in Pain? Yes    Pain Score 7     Pain Location Ankle   travels down Rt leg   Pain Orientation Right    Pain Descriptors / Indicators Burning;Shooting    Aggravating Factors  constant pain    Pain Relieving Factors laying supine              [facility] PT Assessment - 05/23/20 0001      Assessment   Medical Diagnosis LBP, Rt hip pain, Rt calf pain    Referring Provider (PT) de [place], Blondell Reveal, MD    Prior Therapy not for this episode       Precautions   Precautions None      Restrictions   Weight Bearing Restrictions No      Balance Screen   Has the patient fallen in the past 6 months No      [place] residence    Living Arrangements Spouse/significant other;Children      Prior Function   Level of Independence Independent    Vocation Full time employment    Patent examiner      Cognition   Overall Cognitive Status Within Functional Limits for tasks assessed      Sensation   Additional Comments burning down post Rt leg and lateral ankle      Posture/Postural Control   Posture Comments seated in WC upon arrival, Rt post innom rotation      ROM / Strength   AROM / PROM / Strength --   not appropriate to test due to pain levels     Palpation   Palpation comment spasm in piriformis, HS and gastroc on Rt side      Special Tests   Other special tests +SLR  Rt                      Objective measurements completed on examination: See above findings.       [facility] Adult PT Treatment/Exercise - 05/23/20 0001      Exercises   Exercises Knee/Hip      Knee/Hip Exercises: Stretches   Active Hamstring Stretch Limitations with nerve glides via DF/PF    Piriformis Stretch Limitations hooklying pull across    Other Knee/Hip Stretches LTR      Knee/Hip Exercises: Supine   Hip Adduction Isometric Limitations pillow bw knees      Manual Therapy   Manual Therapy Muscle Energy Technique    Manual therapy comments Rt sciatic nerve glides    Muscle Energy Technique \                  PT Education - 05/23/20 1517    Education Details anatomy of condition, POC, HEP, exercise form/rationale    Person(s) Educated Patient    Methods Explanation;Demonstration;Tactile cues;Verbal cues;Handout    Comprehension Verbalized understanding;Returned demonstration;Verbal cues required;Tactile cues required;Need further instruction                PT Long Term Goals - 05/23/20 1518      PT LONG TERM GOAL #1   Title resolution of burning in Rt LE    Baseline constant but fluctuates in intensity at eval    Time 4    Period Weeks    Status New    Target Date 06/21/20      PT LONG TERM GOAL #2   Title pt will be able to return to regular exercise    Baseline unable at eval due to pain, is a runner    Time 4    Period Weeks    Status New    Target Date 06/21/20      PT LONG TERM GOAL #3   Title demo gross 5/5 hip strength for support to lumbopelvic region    Baseline not appropraite to test due to pelvic rotation at eval    Time 4    Period Weeks    Status New    Target Date 06/21/20                  Plan - 05/23/20 1450    Clinical Impression Statement pt presents to PT with insidious onset of RLE pain that is burning to ankle. notable pelvic rotation (Rt post) resulting in functional LLD (Lt longer). Significant spasm in Rt piriformis and HS so piriformis was DN following MET to correct pelvic alignment. He did report some relief but continues to feel burning in his Rt ankle. no change in pain when laying prone- did not trial ext due to reported h/o stenosis diagnosis. Did have spinal surgery at L4-5 but unsure exactly what type. + SLR on Rt leg even after MET and DN. Will continue to monitor burning to see if pain centralizes at all over the weekend and discussed MET with pt and his wife so they can do it at home PRN.    Personal Factors and Comorbidities Comorbidity 2    Comorbidities runner with h/o injuries, h/o spinal surgery    Examination-Activity Limitations Bed Mobility;Bend;Sit;Sleep;Stand;Lift    Examination-Participation Restrictions Occupation;Other    Stability/Clinical Decision Making Unstable/Unpredictable    Clinical Decision Making Moderate    Rehab Potential Good    PT Frequency 2x / week  PT Duration 4 weeks    PT Treatment/Interventions ADLs/Self Care Home Management;Cryotherapy;Aquatic  Therapy;Electrical Stimulation;Gait training;Ultrasound;Traction;Moist Heat;Iontophoresis 37m/ml Dexamethasone;Stair training;Functional mobility training;Therapeutic activities;Therapeutic exercise;Neuromuscular re-education;Manual techniques;Patient/family education;Passive range of motion;Dry needling;Taping;Joint Manipulations;Spinal Manipulations    PT Home Exercise Plan [id number]           Patient will benefit from skilled therapeutic intervention in order to improve the following deficits and impairments:  Decreased range of motion,Difficulty walking,Increased muscle spasms,Decreased activity tolerance,Pain,Improper body mechanics,Impaired flexibility,Decreased strength,Postural dysfunction  Visit Diagnosis: Acute right-sided low back pain, unspecified whether sciatica present - Plan: PT plan of care cert/re-cert  Pain in right hip - Plan: PT plan of care cert/re-cert  Difficulty in walking, not elsewhere classified - Plan: PT plan of care cert/re-cert     Problem List Patient Active Problem List   Diagnosis Date Noted  . Lumbosacral radiculopathy 05/23/2020  . Low back pain 04/24/2020  . Hip pain 04/24/2020  . Strain of calf muscle 04/24/2020  . Prediabetes 04/24/2020  . Hyperlipidemia 04/24/2020  . Transaminitis 04/24/2020   Denver Harder C. Jamesen Stahnke PT, DPT 05/23/20 3:29 PM   [facility] [address] [address]Phone: [phone]  Fax:  [phone] Name: MZymier RodgersMRN: [id number] of Birth: 206/17/1987

## 2020-05-23 NOTE — Telephone Encounter (Signed)
Called patient to update him on the recommendation of Dr. de Guam Patient is aware and agreeable to imaging Patients wife is concerned with the amount of pain that the patient is in and requested if the patient can be prescribed a steroid pack or take oxycodone that they have at home I reiterated to the patient and his wife that taking opioids is never recommenced off label, especially when the patient has already been prescribed medications to address his inflammation and swelling. Patient is agreeable to adhering to recommendations of Dr. de Guam

## 2020-05-23 NOTE — Progress Notes (Signed)
    Procedures performed today:    None.  Independent interpretation of notes and tests performed by another provider:   None.  Brief History, Exam, Impression, and Recommendations:    Justin Morrow is a 35 year old male presenting for evaluation of radiating low back/buttock pain.  Pain initially began about 3 days ago.  Pain begins in right buttock and radiates down posterior right lower extremity.  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  Has tried TENS unit without significant relief.  Has also tried Aleve without significant relief.  Pain in his lower leg feels like a "burning" pain.  Denies any numbness or tingling.  Currently denies any weakness or trouble with ambulation.  Of note patient has had spinal stenosis surgery about 15 years ago as well as a prior symptomatic herniated disc at L4-L5 in 2016.  BP (!) 142/96   Pulse 72   Ht 5\' 6"  (1.676 m)   Wt 157 lb (71.2 kg)   SpO2 98%   BMI 25.34 kg/m   Lumbar spine with no tenderness to palpation over spinous processes, no tenderness through paraspinal muscles Negative straight leg raise bilaterally Normal range of motion with forward flexion, some reproduction of pain on forward flexion Normal range of motion with back extension, no pain elicited Neurovascularly intact in bilateral lower extremities Slightly antalgic gait, no obvious gait abnormalities otherwise such as foot drop  Lumbosacral radiculopathy Symptoms seem most consistent with lumbosacral radiculopathy -at present patient currently has moderate pain without obvious focal weakness Recommend initial treatment with NSAID and consideration of gabapentin - prescriptions sent to pharmacy Recommend proceeding with physical therapy, patient has appointment arranged for next week Plan for close follow-up about 2 to 3 weeks, if not improving as expected, consider advanced imaging    ___________________________________________ Haroldine Redler de  Guam, MD, ABFM, CAQSM [facility]

## 2020-05-24 ENCOUNTER — Other Ambulatory Visit (HOSPITAL_BASED_OUTPATIENT_CLINIC_OR_DEPARTMENT_OTHER): Payer: Self-pay | Admitting: Family Medicine

## 2020-05-24 ENCOUNTER — Telehealth (HOSPITAL_BASED_OUTPATIENT_CLINIC_OR_DEPARTMENT_OTHER): Payer: Self-pay | Admitting: Family Medicine

## 2020-05-24 DIAGNOSIS — M5417 Radiculopathy, lumbosacral region: Secondary | ICD-10-CM

## 2020-05-24 MED ORDER — PREDNISONE 50 MG PO TABS: ORAL_TABLET | ORAL | 0 refills | Status: DC

## 2020-05-24 MED ORDER — PREDNISONE 10 MG PO TABS: ORAL_TABLET | ORAL | 0 refills | Status: DC

## 2020-05-24 NOTE — Telephone Encounter (Addendum)
Called patient to inform him of the recommendations from Dr. de Guam regarding his pain and xray images Per Dr. de Guam patient needs to stop anti inflammatory and start 5 day course of Prednisone 60 mg tomorrow Per Dr. de Guam the patient also needs an MRI Patients xray showed a large amount of feces in bowel indicating that the patient is constipated which could also be contributing to his pain Encoouraged the patient to take an osmotic laxative to try to help facilitate a bowel movement Patients wife expressed that she is very concerned at the patient level of pain. She states " he is laying in bed moaning at night, he is not sleeping, we have been up for the last 2 nights and we will not make it through the weekend if he does not get something for his pain" I explained to the patient and his wife that given the patients lack of bowel movements and inability to sit on the toilet as his wife is expressing taking any opoid or narcotic would only exacerbate the situation I also explained to the patient and his wife that I have expressed their concerns continually to Dr. Tennis Must Guam and he is strongly advising against the patient taking any opioid or narcotic. The patient and his wife are agreeable to the current plan of starting Prednisone and getting an MRI

## 2020-05-24 NOTE — Addendum Note (Signed)
Addended by: Campbell Riches on: 05/24/2020 03:54 PM   Modules accepted: Orders

## 2020-05-24 NOTE — Telephone Encounter (Signed)
Pt called this morning to get Rx for new analgesic for severe back pain. States he has not slept for 48+ hours. They didn't hear back and called again around 245 for follow up. They are needing a return call and a new Rx called in.

## 2020-05-27 ENCOUNTER — Ambulatory Visit (HOSPITAL_COMMUNITY)
Admission: RE | Admit: 2020-05-27 | Discharge: 2020-05-27 | Disposition: A | Discharge status: Home / Self Care | Payer: No Typology Code available for payment source | Source: Ambulatory Visit | Attending: Family Medicine | Admitting: Family Medicine

## 2020-05-27 DIAGNOSIS — M5417 Radiculopathy, lumbosacral region: Secondary | ICD-10-CM

## 2020-05-28 ENCOUNTER — Ambulatory Visit (HOSPITAL_BASED_OUTPATIENT_CLINIC_OR_DEPARTMENT_OTHER): Payer: No Typology Code available for payment source | Admitting: Physical Therapy

## 2020-05-28 ENCOUNTER — Encounter (HOSPITAL_BASED_OUTPATIENT_CLINIC_OR_DEPARTMENT_OTHER): Payer: Self-pay | Admitting: Physical Therapy

## 2020-05-28 ENCOUNTER — Other Ambulatory Visit: Payer: Self-pay

## 2020-05-28 DIAGNOSIS — M545 Low back pain, unspecified: Secondary | ICD-10-CM | POA: Diagnosis not present

## 2020-05-28 DIAGNOSIS — R262 Difficulty in walking, not elsewhere classified: Secondary | ICD-10-CM

## 2020-05-28 DIAGNOSIS — M25551 Pain in right hip: Secondary | ICD-10-CM

## 2020-05-28 NOTE — Therapy (Signed)
[facility] [place], [place], [address] Phone: [phone]   Fax:  [phone]  Physical Therapy Treatment  Patient Details  Name: Justin Morrow MRN: [id number] Date of Birth: 1985/01/26 Referring Provider (PT): de [place], Blondell Reveal, MD   Encounter Date: 05/28/2020   PT End of Session - 05/28/20 1147    Visit Number 2    Number of Visits 9    Date for PT Re-Evaluation 06/21/20    Authorization Type [facility]    PT Start Time 1101    PT Stop Time 1147    PT Time Calculation (min) 46 min    Activity Tolerance Patient tolerated treatment well    Behavior During Therapy [facility] for tasks assessed/performed           History reviewed. No pertinent past medical history.  Past Surgical History:  Procedure Laterality Date  . KNEE ARTHROSCOPY  1999  . SPINE SURGERY  12/29/2005    There were no vitals filed for this visit.   Subjective Assessment - 05/28/20 1101    Subjective burning on foot and ankle stil comes and goes. was more mobile over the weekend. sitting hurts Rt buttock and kneeling cramps Rt HS.                             [facility] Adult PT Treatment/Exercise - 05/28/20 0001      Knee/Hip Exercises: Stretches   Piriformis Stretch Limitations attempted seated but returned distal symptoms      Knee/Hip Exercises: Standing   Other Standing Knee Exercises lumbar extension-elbows on wall      Knee/Hip Exercises: Prone   Other Prone Exercises prone positioning & prone on elbows    Other Prone Exercises prone glut set- significant difficulty today      Manual Therapy   Manual Therapy Soft tissue mobilization;Joint mobilization    Manual therapy comments skilled palpation and monitoring in TPDN    Joint Mobilization Rt upper sacral PA hold 2 min    Soft tissue mobilization Rt hip, IASTM Rt lateral HS & ITB    Muscle Energy Technique \            Trigger Point Dry Needling -  05/28/20 0001    Consent Given? Yes    Education Handout Provided --   verbal education   Muscles Treated Back/Hip Gluteus minimus;Gluteus medius;Gluteus maximus;Piriformis;Tensor fascia lata    Other Dry Needling all on right    Gluteus Minimus Response Twitch response elicited;Palpable increased muscle length    Gluteus Medius Response Twitch response elicited;Palpable increased muscle length    Gluteus Maximus Response Twitch response elicited;Palpable increased muscle length    Piriformis Response Twitch response elicited;Palpable increased muscle length    Tensor Fascia Lata Response Twitch response elicited;Palpable increased muscle length                PT Education - 05/28/20 1247    Education Details reurn to sport and possible adjustments required    Person(s) Educated Patient    Methods Explanation    Comprehension Verbalized understanding;Need further instruction               PT Long Term Goals - 05/23/20 1518      PT LONG TERM GOAL #1   Title resolution of burning in Rt LE    Baseline constant but fluctuates in intensity at eval    Time 4  Period Weeks    Status New    Target Date 06/21/20      PT LONG TERM GOAL #2   Title pt will be able to return to regular exercise    Baseline unable at eval due to pain, is a runner    Time 4    Period Weeks    Status New    Target Date 06/21/20      PT LONG TERM GOAL #3   Title demo gross 5/5 hip strength for support to lumbopelvic region    Baseline not appropraite to test due to pelvic rotation at eval    Time 4    Period Weeks    Status New    Target Date 06/21/20                 Plan - 05/28/20 1241    Clinical Impression Statement excellent tolerance to DN with decreased spasm today. does feel return of distal symptoms quickly and standing and seated with ability to resolve in both prone positioning and ext at wall- asked him to do these as often as possible to maintain centralization of  symptoms. MRI has not been read by radiologist at this time. poor coordination in glut contraction in prone noted with compensation via hip flexors.    PT Treatment/Interventions ADLs/Self Care Home Management;Cryotherapy;Aquatic Therapy;Electrical Stimulation;Gait training;Ultrasound;Traction;Moist Heat;Iontophoresis 4mg /ml Dexamethasone;Stair training;Functional mobility training;Therapeutic activities;Therapeutic exercise;Neuromuscular re-education;Manual techniques;Patient/family education;Passive range of motion;Dry needling;Taping;Joint Manipulations;Spinal Manipulations    PT Next Visit Plan DN PRN, outcome of extension program?    PT Home Exercise Plan [email or web address]    Consulted and Agree with Plan of Care Patient           Patient will benefit from skilled therapeutic intervention in order to improve the following deficits and impairments:  Decreased range of motion,Difficulty walking,Increased muscle spasms,Decreased activity tolerance,Pain,Improper body mechanics,Impaired flexibility,Decreased strength,Postural dysfunction  Visit Diagnosis: Acute right-sided low back pain, unspecified whether sciatica present  Pain in right hip  Difficulty in walking, not elsewhere classified     Problem List Patient Active Problem List   Diagnosis Date Noted  . Lumbosacral radiculopathy 05/23/2020  . Low back pain 04/24/2020  . Hip pain 04/24/2020  . Strain of calf muscle 04/24/2020  . Prediabetes 04/24/2020  . Hyperlipidemia 04/24/2020  . Transaminitis 04/24/2020   Dheeraj Hail C. Tallyn Holroyd PT, DPT 05/28/20 12:50 PM   [facility] [address] [place], [place], [address] Phone: [phone]   Fax:  [phone]  Name: Finbar Nippert MRN: [id number] Date of Birth: Jul 26, 1985

## 2020-05-29 ENCOUNTER — Encounter (HOSPITAL_BASED_OUTPATIENT_CLINIC_OR_DEPARTMENT_OTHER): Payer: Self-pay | Admitting: Family Medicine

## 2020-05-29 ENCOUNTER — Other Ambulatory Visit (HOSPITAL_BASED_OUTPATIENT_CLINIC_OR_DEPARTMENT_OTHER): Payer: Self-pay | Admitting: Family Medicine

## 2020-05-29 DIAGNOSIS — M48061 Spinal stenosis, lumbar region without neurogenic claudication: Secondary | ICD-10-CM | POA: Insufficient documentation

## 2020-05-29 NOTE — Progress Notes (Signed)
Continues to have low back pain with some radicular symptoms, primarily still right-sided.  Does note some improvement since last week, unsure if related to physical therapy or medications.  Has continued to work with physical therapy.  MRI completed recently with results showing that at L4-L5 level there is moderate disc degeneration with multifactorial severe spinal canal stenosis with left greater than right subarticular narrowing.  Given findings and severity of pain that patient is experiencing, will refer to neurosurgery for further evaluation and management.

## 2020-05-31 ENCOUNTER — Other Ambulatory Visit: Payer: Self-pay

## 2020-05-31 ENCOUNTER — Encounter (HOSPITAL_BASED_OUTPATIENT_CLINIC_OR_DEPARTMENT_OTHER): Payer: Self-pay | Admitting: Physical Therapy

## 2020-05-31 ENCOUNTER — Ambulatory Visit (HOSPITAL_BASED_OUTPATIENT_CLINIC_OR_DEPARTMENT_OTHER): Payer: No Typology Code available for payment source | Admitting: Physical Therapy

## 2020-05-31 DIAGNOSIS — M545 Low back pain, unspecified: Secondary | ICD-10-CM | POA: Diagnosis not present

## 2020-05-31 DIAGNOSIS — R262 Difficulty in walking, not elsewhere classified: Secondary | ICD-10-CM

## 2020-05-31 DIAGNOSIS — R2689 Other abnormalities of gait and mobility: Secondary | ICD-10-CM

## 2020-05-31 DIAGNOSIS — M25551 Pain in right hip: Secondary | ICD-10-CM

## 2020-05-31 NOTE — Therapy (Signed)
[facility] [place], [place], [address] Phone: [phone]   Fax:  [phone]  Physical Therapy Treatment  Patient Details  Name: Justin Morrow MRN: [id number] Date of Birth: 07/30/1985 Referring Provider (PT): de [place], Blondell Reveal, MD   Encounter Date: 05/31/2020   PT End of Session - 05/31/20 1521    Visit Number 3    Number of Visits 9    Date for PT Re-Evaluation 06/21/20    Authorization Type [facility]    PT Start Time 1521    PT Stop Time 1603    PT Time Calculation (min) 42 min    Activity Tolerance Patient tolerated treatment well    Behavior During Therapy [facility] for tasks assessed/performed           History reviewed. No pertinent past medical history.  Past Surgical History:  Procedure Laterality Date  . KNEE ARTHROSCOPY  1999  . SPINE SURGERY  12/29/2005    There were no vitals filed for this visit.   Subjective Assessment - 05/31/20 1521    Subjective Today little to no pain and have been able to sit.    Currently in Pain? No/denies              [facility] PT Assessment - 05/31/20 0001      Special Tests   Other special tests +SLR Rt continues                         [facility] Adult PT Treatment/Exercise - 05/31/20 0001      Knee/Hip Exercises: Aerobic   Stationary Bike upright bike in gym, cues for form      Knee/Hip Exercises: Standing   Gait Training arm swing/trunk rotation      Knee/Hip Exercises: Seated   Other Seated Knee/Hip Exercises hip hinge with dowel      Manual Therapy   Manual Therapy Passive ROM    Manual therapy comments Rt LE 90/90 nerve glide    Passive ROM Rt hip                  PT Education - 05/31/20 1934    Education Details see plan    Person(s) Educated Patient;Spouse    Methods Explanation    Comprehension Verbalized understanding;Need further instruction               PT Long Term Goals - 05/23/20 1518      PT  LONG TERM GOAL #1   Title resolution of burning in Rt LE    Baseline constant but fluctuates in intensity at eval    Time 4    Period Weeks    Status New    Target Date 06/21/20      PT LONG TERM GOAL #2   Title pt will be able to return to regular exercise    Baseline unable at eval due to pain, is a runner    Time 4    Period Weeks    Status New    Target Date 06/21/20      PT LONG TERM GOAL #3   Title demo gross 5/5 hip strength for support to lumbopelvic region    Baseline not appropraite to test due to pelvic rotation at eval    Time 4    Period Weeks    Status New    Target Date 06/21/20  Plan - 05/31/20 1935    Clinical Impression Statement pt has made excellent progress so far and did not need DN today. SLR limited to approx 20 deg before symptoms felt. worked nerve glides from 90/90 to improve mobility. Used upright, stationary bike today for form education and did not experience an increase in pain. Will begin walking short distances to tolerance. Worked on trunk rotation in gait in order to decrease lateral SB and abnormal pressures through hips. Is going to see Dr Vertell Limber for surgical consult.    PT Treatment/Interventions ADLs/Self Care Home Management;Cryotherapy;Aquatic Therapy;Electrical Stimulation;Gait training;Ultrasound;Traction;Moist Heat;Iontophoresis 4mg /ml Dexamethasone;Stair training;Functional mobility training;Therapeutic activities;Therapeutic exercise;Neuromuscular re-education;Manual techniques;Patient/family education;Passive range of motion;Dry needling;Taping;Joint Manipulations;Spinal Manipulations    PT Next Visit Plan how did walking go? review gait pattern    PT Home Exercise Plan [email or web address]    Consulted and Agree with Plan of Care Patient           Patient will benefit from skilled therapeutic intervention in order to improve the following deficits and impairments:  Decreased range of motion,Difficulty walking,Increased  muscle spasms,Decreased activity tolerance,Pain,Improper body mechanics,Impaired flexibility,Decreased strength,Postural dysfunction  Visit Diagnosis: Acute right-sided low back pain, unspecified whether sciatica present  Pain in right hip  Difficulty in walking, not elsewhere classified  Other abnormalities of gait and mobility     Problem List Patient Active Problem List   Diagnosis Date Noted  . Spinal stenosis at L4-L5 level 05/29/2020  . Lumbosacral radiculopathy 05/23/2020  . Low back pain 04/24/2020  . Hip pain 04/24/2020  . Strain of calf muscle 04/24/2020  . Prediabetes 04/24/2020  . Hyperlipidemia 04/24/2020  . Transaminitis 04/24/2020    Justin Morrow C. Breeley Bischof PT, DPT 05/31/20 7:54 PM   [facility] [address] [place], [place], [address] Phone: [phone]   Fax:  [phone]  Name: Justin Morrow MRN: [id number] Date of Birth: Jun 05, 1985

## 2020-06-01 ENCOUNTER — Encounter (HOSPITAL_BASED_OUTPATIENT_CLINIC_OR_DEPARTMENT_OTHER): Payer: Self-pay

## 2020-06-01 ENCOUNTER — Ambulatory Visit (HOSPITAL_BASED_OUTPATIENT_CLINIC_OR_DEPARTMENT_OTHER): Payer: No Typology Code available for payment source

## 2020-06-03 ENCOUNTER — Encounter (HOSPITAL_BASED_OUTPATIENT_CLINIC_OR_DEPARTMENT_OTHER): Payer: Self-pay | Admitting: Family Medicine

## 2020-06-03 DIAGNOSIS — D239 Other benign neoplasm of skin, unspecified: Secondary | ICD-10-CM

## 2020-06-03 DIAGNOSIS — M48061 Spinal stenosis, lumbar region without neurogenic claudication: Secondary | ICD-10-CM

## 2020-06-05 ENCOUNTER — Encounter (HOSPITAL_BASED_OUTPATIENT_CLINIC_OR_DEPARTMENT_OTHER): Payer: Self-pay | Admitting: Physical Therapy

## 2020-06-05 ENCOUNTER — Other Ambulatory Visit: Payer: Self-pay

## 2020-06-05 ENCOUNTER — Ambulatory Visit (HOSPITAL_BASED_OUTPATIENT_CLINIC_OR_DEPARTMENT_OTHER): Payer: No Typology Code available for payment source | Admitting: Physical Therapy

## 2020-06-05 DIAGNOSIS — M545 Low back pain, unspecified: Secondary | ICD-10-CM

## 2020-06-05 DIAGNOSIS — M25551 Pain in right hip: Secondary | ICD-10-CM

## 2020-06-05 DIAGNOSIS — R262 Difficulty in walking, not elsewhere classified: Secondary | ICD-10-CM

## 2020-06-05 NOTE — Therapy (Signed)
[place], [place], [address] Phone: [phone]   Fax:  [phone]  Physical Therapy Treatment  Patient Details  Name: Justin Morrow MRN: [id number] Date of Birth: January 28, 1985 Referring Provider (PT): de [place], Blondell Reveal, MD   Encounter Date: 06/05/2020   PT End of Session - 06/05/20 1558    Visit Number 4    Number of Visits 9    Date for PT Re-Evaluation 06/21/20    Authorization Type [facility]    PT Start Time 1558    PT Stop Time 1642    PT Time Calculation (min) 44 min    Activity Tolerance Patient tolerated treatment well    Behavior During Therapy [facility] for tasks assessed/performed           History reviewed. No pertinent past medical history.  Past Surgical History:  Procedure Laterality Date  . KNEE ARTHROSCOPY  1999  . SPINE SURGERY  12/29/2005    There were no vitals filed for this visit.       [facility] PT Assessment - 06/05/20 0001      ROM / Strength   AROM / PROM / Strength AROM      AROM   Overall AROM Comments knee ext from 90/90 in nerve glide: Rt 30, Lt 45                         [facility] Adult PT Treatment/Exercise - 06/05/20 0001      Knee/Hip Exercises: Stretches   Other Knee/Hip Stretches single knee to shoulder- 2 min ea    Other Knee/Hip Stretches 90/90 nerve glide      Knee/Hip Exercises: Aerobic   Stationary Bike upright bike 3 min L4      Knee/Hip Exercises: Standing   Other Standing Knee Exercises standing hip hinge with dowel, also with mirror and no dowel    Other Standing Knee Exercises wall squats      Knee/Hip Exercises: Seated   Other Seated Knee/Hip Exercises hip hinge with dowel                       PT Long Term Goals - 05/23/20 1518      PT LONG TERM GOAL #1   Title resolution of burning in Rt LE    Baseline constant but fluctuates in intensity at eval    Time 4    Period Weeks    Status New    Target Date  06/21/20      PT LONG TERM GOAL #2   Title pt will be able to return to regular exercise    Baseline unable at eval due to pain, is a runner    Time 4    Period Weeks    Status New    Target Date 06/21/20      PT LONG TERM GOAL #3   Title demo gross 5/5 hip strength for support to lumbopelvic region    Baseline not appropraite to test due to pelvic rotation at eval    Time 4    Period Weeks    Status New    Target Date 06/21/20                 Plan - 06/05/20 1651    Clinical Impression Statement Not having increased pain as he was before but consistently experiencing tingling into foot. Has good ROM in hip joint but requires further  re-ed to improve functional use of hip hinge. Asked him to bring shorts and running shoes to his next apt and we will evaluate running form if he has been feeling good.    PT Treatment/Interventions ADLs/Self Care Home Management;Cryotherapy;Aquatic Therapy;Electrical Stimulation;Gait training;Ultrasound;Traction;Moist Heat;Iontophoresis 4mg /ml Dexamethasone;Stair training;Functional mobility training;Therapeutic activities;Therapeutic exercise;Neuromuscular re-education;Manual techniques;Patient/family education;Passive range of motion;Dry needling;Taping;Joint Manipulations;Spinal Manipulations    PT Next Visit Plan review hip hinge, trial running?    PT Home Exercise Plan [id number]    Consulted and Agree with Plan of Care Patient           Patient will benefit from skilled therapeutic intervention in order to improve the following deficits and impairments:  Decreased range of motion,Difficulty walking,Increased muscle spasms,Decreased activity tolerance,Pain,Improper body mechanics,Impaired flexibility,Decreased strength,Postural dysfunction  Visit Diagnosis: Acute right-sided low back pain, unspecified whether sciatica present  Pain in right hip  Difficulty in walking, not elsewhere classified     Problem List Patient Active Problem  List   Diagnosis Date Noted  . Spinal stenosis at L4-L5 level 05/29/2020  . Lumbosacral radiculopathy 05/23/2020  . Low back pain 04/24/2020  . Hip pain 04/24/2020  . Strain of calf muscle 04/24/2020  . Prediabetes 04/24/2020  . Hyperlipidemia 04/24/2020  . Transaminitis 04/24/2020    Lashaun Krapf C. Khalib Fendley PT, DPT 06/05/20 4:56 PM   [facility] [address] [place], [place], [address] Phone: [phone]   Fax:  [phone]  Name: Justin Morrow MRN: [id number] Date of Birth: 1986/01/11

## 2020-06-11 ENCOUNTER — Ambulatory Visit (HOSPITAL_BASED_OUTPATIENT_CLINIC_OR_DEPARTMENT_OTHER): Payer: No Typology Code available for payment source | Admitting: Physical Therapy

## 2020-06-11 ENCOUNTER — Encounter (HOSPITAL_BASED_OUTPATIENT_CLINIC_OR_DEPARTMENT_OTHER): Payer: Self-pay | Admitting: Physical Therapy

## 2020-06-11 ENCOUNTER — Other Ambulatory Visit: Payer: Self-pay

## 2020-06-11 DIAGNOSIS — R262 Difficulty in walking, not elsewhere classified: Secondary | ICD-10-CM

## 2020-06-11 DIAGNOSIS — M25551 Pain in right hip: Secondary | ICD-10-CM

## 2020-06-11 DIAGNOSIS — M545 Low back pain, unspecified: Secondary | ICD-10-CM

## 2020-06-11 NOTE — Therapy (Signed)
[place], [place], [address] Phone: [phone]   Fax:  [phone]  Physical Therapy Treatment  Patient Details  Name: Justin Morrow MRN: [id number] Date of Birth: [birth year] Referring Provider (PT): de [place], Blondell Reveal, MD   Encounter Date: 06/11/2020   PT End of Session - 06/11/20 1437    Visit Number 5    Number of Visits 9    Date for PT Re-Evaluation 06/21/20    Authorization Type [facility]    PT Start Time 1430    PT Stop Time 2947    PT Time Calculation (min) 47 min    Activity Tolerance Patient tolerated treatment well    Behavior During Therapy [facility] for tasks assessed/performed           History reviewed. No pertinent past medical history.  Past Surgical History:  Procedure Laterality Date  . KNEE ARTHROSCOPY  1999  . SPINE SURGERY  12/29/2005    There were no vitals filed for this visit.   Subjective Assessment - 06/11/20 1432    Subjective Walked 5-6 hr and had stabbing pains just in Rt calf. Noticing that standing incr Rt SIJ pain and will decr with sitting. Rt hamstring region feels like crap at the end of the day. Now seeing surgeon 6/22.                             [facility] Adult PT Treatment/Exercise - 06/11/20 0001      Knee/Hip Exercises: Standing   Gait Training running pattern- trunk rotation    Other Standing Knee Exercises runner step up- red tband resist      Manual Therapy   Manual therapy comments skilled palpation and monitoring during TPDN    Joint Mobilization post sacral tilting, gr 4    Soft tissue mobilization Rt HS            Trigger Point Dry Needling - 06/11/20 0001    Muscles Treated Lower Quadrant Hamstring    Hamstring Response Twitch response elicited;Palpable increased muscle length   Right               PT Education - 06/11/20 1717    Education Details review of symptoms & anatomy of condition/SIJ anatomy and  physiology, return to running    Person(s) Educated Patient    Methods Explanation    Comprehension Verbalized understanding;Need further instruction               PT Long Term Goals - 05/23/20 1518      PT LONG TERM GOAL #1   Title resolution of burning in Rt LE    Baseline constant but fluctuates in intensity at eval    Time 4    Period Weeks    Status New    Target Date 06/21/20      PT LONG TERM GOAL #2   Title pt will be able to return to regular exercise    Baseline unable at eval due to pain, is a runner    Time 4    Period Weeks    Status New    Target Date 06/21/20      PT LONG TERM GOAL #3   Title demo gross 5/5 hip strength for support to lumbopelvic region    Baseline not appropraite to test due to pelvic rotation at eval    Time 4    Period Weeks  Status New    Target Date 06/21/20                 Plan - 06/11/20 1713    Clinical Impression Statement DN to hamstrings was effective to decrease concordant pain. Evaluated running pattern and found limited trunk rotation resulting in circumduction of lower legs bil in swing through placing extra stress on SIJ bil. SI mobs on Rt not tol due to incr tingling in foot and minimal mobilizations tol in post tilting as it also incr tingling in foot. Asked him to wait until tomorrow at this same time to det how he feels after running- if he is feeling ok, he can run short distances (about 5 min) and then walk in between.    PT Treatment/Interventions ADLs/Self Care Home Management;Cryotherapy;Aquatic Therapy;Electrical Stimulation;Gait training;Ultrasound;Traction;Moist Heat;Iontophoresis 4mg /ml Dexamethasone;Stair training;Functional mobility training;Therapeutic activities;Therapeutic exercise;Neuromuscular re-education;Manual techniques;Patient/family education;Passive range of motion;Dry needling;Taping;Joint Manipulations;Spinal Manipulations    PT Next Visit Plan how did running go? outcome of HS DN?    PT  Home Exercise Plan [id number]    Consulted and Agree with Plan of Care Patient           Patient will benefit from skilled therapeutic intervention in order to improve the following deficits and impairments:  Decreased range of motion,Difficulty walking,Increased muscle spasms,Decreased activity tolerance,Pain,Improper body mechanics,Impaired flexibility,Decreased strength,Postural dysfunction  Visit Diagnosis: Acute right-sided low back pain, unspecified whether sciatica present  Pain in right hip  Difficulty in walking, not elsewhere classified     Problem List Patient Active Problem List   Diagnosis Date Noted  . Spinal stenosis at L4-L5 level 05/29/2020  . Lumbosacral radiculopathy 05/23/2020  . Low back pain 04/24/2020  . Hip pain 04/24/2020  . Strain of calf muscle 04/24/2020  . Prediabetes 04/24/2020  . Hyperlipidemia 04/24/2020  . Transaminitis 04/24/2020   Gordon Carlson C. Palin Tristan PT, DPT 06/11/20 5:20 PM   [facility] [address] [place], [place], [address] Phone: [phone]   Fax:  [phone]  Name: Justin Morrow MRN: [id number] Date of Birth: [birth year]

## 2020-06-12 ENCOUNTER — Encounter (HOSPITAL_BASED_OUTPATIENT_CLINIC_OR_DEPARTMENT_OTHER): Payer: Self-pay | Admitting: Physical Therapy

## 2020-06-17 ENCOUNTER — Ambulatory Visit (HOSPITAL_BASED_OUTPATIENT_CLINIC_OR_DEPARTMENT_OTHER): Payer: No Typology Code available for payment source | Admitting: Family Medicine

## 2020-06-18 ENCOUNTER — Encounter (HOSPITAL_BASED_OUTPATIENT_CLINIC_OR_DEPARTMENT_OTHER): Payer: Self-pay | Admitting: Physical Therapy

## 2020-06-18 ENCOUNTER — Other Ambulatory Visit: Payer: Self-pay

## 2020-06-18 ENCOUNTER — Ambulatory Visit (HOSPITAL_BASED_OUTPATIENT_CLINIC_OR_DEPARTMENT_OTHER): Payer: No Typology Code available for payment source | Attending: Family Medicine | Admitting: Physical Therapy

## 2020-06-18 ENCOUNTER — Encounter (HOSPITAL_BASED_OUTPATIENT_CLINIC_OR_DEPARTMENT_OTHER): Payer: Self-pay | Admitting: Family Medicine

## 2020-06-18 DIAGNOSIS — M25551 Pain in right hip: Secondary | ICD-10-CM | POA: Insufficient documentation

## 2020-06-18 DIAGNOSIS — R262 Difficulty in walking, not elsewhere classified: Secondary | ICD-10-CM | POA: Insufficient documentation

## 2020-06-18 DIAGNOSIS — R2689 Other abnormalities of gait and mobility: Secondary | ICD-10-CM | POA: Diagnosis present

## 2020-06-18 DIAGNOSIS — M545 Low back pain, unspecified: Secondary | ICD-10-CM | POA: Diagnosis present

## 2020-06-18 DIAGNOSIS — G8929 Other chronic pain: Secondary | ICD-10-CM | POA: Diagnosis present

## 2020-06-18 DIAGNOSIS — M5417 Radiculopathy, lumbosacral region: Secondary | ICD-10-CM

## 2020-06-18 DIAGNOSIS — M25561 Pain in right knee: Secondary | ICD-10-CM | POA: Diagnosis present

## 2020-06-18 NOTE — Therapy (Signed)
[name] [address] [place], [place], [address] Phone: [phone]   Fax:  [phone]  Physical Therapy Treatment  Patient Details  Name: Justin Morrow MRN: [id number] Date of Birth: 08/14/1985 Referring Provider (PT): de [place], Blondell Reveal, MD   Encounter Date: 06/18/2020   PT End of Session - 06/18/20 0849    Visit Number 6    Number of Visits 9    Date for PT Re-Evaluation 06/21/20    Authorization Type [facility]    PT Start Time 0845    PT Stop Time 0929    PT Time Calculation (min) 44 min    Activity Tolerance Patient tolerated treatment well    Behavior During Therapy [facility] for tasks assessed/performed           History reviewed. No pertinent past medical history.  Past Surgical History:  Procedure Laterality Date  . KNEE ARTHROSCOPY  1999  . SPINE SURGERY  12/29/2005    There were no vitals filed for this visit.   Subjective Assessment - 06/18/20 0845    Subjective Yesterday afternoon got bad with burning/tingling into Rt HS and calf/lateral ankle                             [facility] Adult PT Treatment/Exercise - 06/18/20 0001      Knee/Hip Exercises: Stretches   Passive Hamstring Stretch Limitations seated with DF/PF    Piriformis Stretch Limitations seted figure 4      Knee/Hip Exercises: Standing   Other Standing Knee Exercises hip hinge with dowel      Knee/Hip Exercises: Seated   Other Seated Knee/Hip Exercises hip hinge- sliding towel on table      Manual Therapy   Manual therapy comments skilled palpation and monitoring during TPDN    Soft tissue mobilization Rt hip    Muscle Energy Technique Rt hip flexors/Lt extensors            Trigger Point Dry Needling - 06/18/20 0001    Gluteus Medius Response Twitch response elicited;Palpable increased muscle length   Rt   Piriformis Response Twitch response elicited;Palpable increased muscle length   Rt   Tensor Fascia Lata  Response Twitch response elicited;Palpable increased muscle length   Rt                    PT Long Term Goals - 05/23/20 1518      PT LONG TERM GOAL #1   Title resolution of burning in Rt LE    Baseline constant but fluctuates in intensity at eval    Time 4    Period Weeks    Status New    Target Date 06/21/20      PT LONG TERM GOAL #2   Title pt will be able to return to regular exercise    Baseline unable at eval due to pain, is a runner    Time 4    Period Weeks    Status New    Target Date 06/21/20      PT LONG TERM GOAL #3   Title demo gross 5/5 hip strength for support to lumbopelvic region    Baseline not appropraite to test due to pelvic rotation at eval    Time 4    Period Weeks    Status New    Target Date 06/21/20  Plan - 06/18/20 1107    Clinical Impression Statement DN to Rt hip decreased tension Noted drop onto Rt foot in gait and found pelvic rotation that was addressed. decreased discomfort following treatment. Asked him to do a sort of "refresh" at lunch time of a short walk, stretches and TENS to be proactive. will assess at next visit.    PT Treatment/Interventions ADLs/Self Care Home Management;Cryotherapy;Aquatic Therapy;Electrical Stimulation;Gait training;Ultrasound;Traction;Moist Heat;Iontophoresis 4mg /ml Dexamethasone;Stair training;Functional mobility training;Therapeutic activities;Therapeutic exercise;Neuromuscular re-education;Manual techniques;Patient/family education;Passive range of motion;Dry needling;Taping;Joint Manipulations;Spinal Manipulations    PT Next Visit Plan check pelvic rotation    PT Home Exercise Plan [id number]    Consulted and Agree with Plan of Care Patient           Patient will benefit from skilled therapeutic intervention in order to improve the following deficits and impairments:  Decreased range of motion,Difficulty walking,Increased muscle spasms,Decreased activity  tolerance,Pain,Improper body mechanics,Impaired flexibility,Decreased strength,Postural dysfunction  Visit Diagnosis: Acute right-sided low back pain, unspecified whether sciatica present  Pain in right hip  Difficulty in walking, not elsewhere classified  Other abnormalities of gait and mobility  Chronic pain of right knee     Problem List Patient Active Problem List   Diagnosis Date Noted  . Spinal stenosis at L4-L5 level 05/29/2020  . Lumbosacral radiculopathy 05/23/2020  . Low back pain 04/24/2020  . Hip pain 04/24/2020  . Strain of calf muscle 04/24/2020  . Prediabetes 04/24/2020  . Hyperlipidemia 04/24/2020  . Transaminitis 04/24/2020   [name] PT, DPT 06/18/20 11:12 AM   [place], [place], [address] Phone: [phone]   Fax:  [phone]  Name: Justin Morrow MRN: [id number] Date of Birth: February 18, 1985

## 2020-06-20 MED ORDER — GABAPENTIN 300 MG PO CAPS: 300.0000 mg | ORAL_CAPSULE | Freq: Three times a day (TID) | ORAL | 0 refills | Status: DC

## 2020-06-21 ENCOUNTER — Other Ambulatory Visit: Payer: Self-pay

## 2020-06-21 ENCOUNTER — Encounter (HOSPITAL_BASED_OUTPATIENT_CLINIC_OR_DEPARTMENT_OTHER): Payer: Self-pay | Admitting: Physical Therapy

## 2020-06-21 ENCOUNTER — Ambulatory Visit (HOSPITAL_BASED_OUTPATIENT_CLINIC_OR_DEPARTMENT_OTHER): Payer: No Typology Code available for payment source | Admitting: Physical Therapy

## 2020-06-21 DIAGNOSIS — M25551 Pain in right hip: Secondary | ICD-10-CM

## 2020-06-21 DIAGNOSIS — M545 Low back pain, unspecified: Secondary | ICD-10-CM

## 2020-06-21 DIAGNOSIS — R262 Difficulty in walking, not elsewhere classified: Secondary | ICD-10-CM

## 2020-06-21 NOTE — Therapy (Signed)
[employer] [address] [place], [place], [address] Phone: [phone]   Fax:  [phone]  Physical Therapy Treatment/ERO  Patient Details  Name: Justin Morrow MRN: [id number] Date of Birth: 1985-07-18 Referring Provider (PT): de [place], Blondell Reveal, MD   Encounter Date: 06/21/2020   PT End of Session - 06/21/20 0848     Visit Number 7    Number of Visits 15    Date for PT Re-Evaluation 07/26/20    Authorization Type [facility]    PT Start Time 0845    PT Stop Time 0930    PT Time Calculation (min) 45 min    Activity Tolerance Patient tolerated treatment well    Behavior During Therapy [facility] for tasks assessed/performed             History reviewed. No pertinent past medical history.  Past Surgical History:  Procedure Laterality Date   KNEE ARTHROSCOPY  1999   SPINE SURGERY  12/29/2005    There were no vitals filed for this visit.   Subjective Assessment - 06/21/20 1255     Subjective Just a little burning in lateral ankle. mild tighhtness in lateral hamstrings and piriformis    Currently in Pain? No/denies                [facility] PT Assessment - 06/21/20 0001       Assessment   Medical Diagnosis LBP, Rt hip pain, Rt calf pain    Referring Provider (PT) de [place], Blondell Reveal, MD      Precautions   Precautions None      Restrictions   Weight Bearing Restrictions No      Balance Screen   Has the patient fallen in the past 6 months No      [place] residence    Living Arrangements Spouse/significant other;Children      Prior Function   Level of Independence Independent    Vocation Full time employment    Vocation Requirements psychologist      Cognition   Overall Cognitive Status Within Functional Limits for tasks assessed      Sensation   Additional Comments occasional burning Rt lateral ankle      ROM / Strength   AROM / PROM / Strength Strength       Strength   Overall Strength Comments Lt hip abd and Rt hip ext 4/5      Palpation   Palpation comment tightness rt gastroc      Special Tests   Other special tests +SLR at 39                           [facility] Adult PT Treatment/Exercise - 06/21/20 0001       Knee/Hip Exercises: Standing   Other Standing Knee Exercises hip hinge green tband under feet      Knee/Hip Exercises: Prone   Other Prone Exercises qped ab set, alt leg ext                    PT Education - 06/21/20 1308     Education Details goals, beach, POC    Person(s) Educated Patient    Methods Explanation    Comprehension Verbalized understanding;Need further instruction                 PT Long Term Goals - 06/21/20 2979  PT LONG TERM GOAL #1   Title resolution of burning in Rt LE    Baseline occasional burning on lateral ankle but dissipates on its own    Status Partially Met      PT LONG TERM GOAL #2   Title pt will be able to return to regular exercise    Baseline walking regularly but noted increased pain after running trial at PT; walking 20-30 min    Status On-going      PT LONG TERM GOAL #3   Title demo gross 5/5 hip strength for support to lumbopelvic region    Baseline see flowsheet    Status On-going      PT LONG TERM GOAL #4   Title lifting floor to waist height    Baseline will cont to educate    Time 5    Period Weeks    Status New    Target Date 07/26/20      PT LONG TERM GOAL #5   Title pusing/pulling for lawn mowing    Baseline will progress core stability & tolerance to these motions    Time 5    Period Weeks    Status New    Target Date 07/26/20                   Plan - 06/21/20 1300     Clinical Impression Statement Pt is making progress toward goals and has improved significantly since beginning PT. Since goals have been partially met and he cont to improve, we agreed to extend POC to cont to address limitations. Notable  difficulty dissociating pelvis and engaging core musculature- required heavy cuing today. Also returns to tendency to hinge from thoracolumbar junction rather than via hip hinge. Is going to the beach for the next week- will focus on cont to stretch regularly, hip hinge and being mobile. Asked if he walks on the beach to do short distances each way to avoid long duration of LEs on uneven surfaces. shoe wear indicates lateral heel strike bilaterally. will cont to progress lumbopelvic strength and control when he returns and see what the surgeon says.    PT Treatment/Interventions ADLs/Self Care Home Management;Cryotherapy;Aquatic Therapy;Electrical Stimulation;Gait training;Ultrasound;Traction;Moist Heat;Iontophoresis 21m/ml Dexamethasone;Stair training;Functional mobility training;Therapeutic activities;Therapeutic exercise;Neuromuscular re-education;Manual techniques;Patient/family education;Passive range of motion;Dry needling;Taping;Joint Manipulations;Spinal Manipulations    PT Next Visit Plan pelvic tilts- supine & qped    PT Home Exercise Plan [id number]    Consulted and Agree with Plan of Care Patient             Patient will benefit from skilled therapeutic intervention in order to improve the following deficits and impairments:  Decreased range of motion, Difficulty walking, Increased muscle spasms, Decreased activity tolerance, Pain, Improper body mechanics, Impaired flexibility, Decreased strength, Postural dysfunction  Visit Diagnosis: Acute right-sided low back pain, unspecified whether sciatica present - Plan: PT plan of care cert/re-cert  Pain in right hip - Plan: PT plan of care cert/re-cert  Difficulty in walking, not elsewhere classified - Plan: PT plan of care cert/re-cert     Problem List Patient Active Problem List   Diagnosis Date Noted   Spinal stenosis at L4-L5 level 05/29/2020   Lumbosacral radiculopathy 05/23/2020   Low back pain 04/24/2020   Hip pain 04/24/2020    Strain of calf muscle 04/24/2020   Prediabetes 04/24/2020   Hyperlipidemia 04/24/2020   Transaminitis 04/24/2020   Marqui Formby C. Dorla Guizar PT, DPT 06/21/20 1:11 PM   [place]  [place], [place], [address] Phone: [phone]   Fax:  [phone]  Name: Justin Morrow MRN: [id number] Date of Birth: 01-18-1985

## 2020-07-03 ENCOUNTER — Ambulatory Visit: Payer: No Typology Code available for payment source | Admitting: Dietician

## 2020-07-03 ENCOUNTER — Encounter (HOSPITAL_BASED_OUTPATIENT_CLINIC_OR_DEPARTMENT_OTHER): Payer: No Typology Code available for payment source | Admitting: Physical Therapy

## 2020-07-04 ENCOUNTER — Other Ambulatory Visit: Payer: Self-pay | Admitting: Neurosurgery

## 2020-07-05 ENCOUNTER — Ambulatory Visit (HOSPITAL_BASED_OUTPATIENT_CLINIC_OR_DEPARTMENT_OTHER): Payer: No Typology Code available for payment source | Admitting: Physical Therapy

## 2020-07-05 ENCOUNTER — Encounter (HOSPITAL_BASED_OUTPATIENT_CLINIC_OR_DEPARTMENT_OTHER): Payer: Self-pay | Admitting: Physical Therapy

## 2020-07-05 ENCOUNTER — Other Ambulatory Visit: Payer: Self-pay

## 2020-07-05 DIAGNOSIS — M25551 Pain in right hip: Secondary | ICD-10-CM

## 2020-07-05 DIAGNOSIS — R262 Difficulty in walking, not elsewhere classified: Secondary | ICD-10-CM

## 2020-07-05 DIAGNOSIS — M545 Low back pain, unspecified: Secondary | ICD-10-CM

## 2020-07-05 NOTE — Therapy (Signed)
[facility] [address] [place], [place], [address] Phone: [phone]   Fax:  [phone]  Physical Therapy Treatment  Patient Details  Name: Justin Morrow MRN: [id number] Date of Birth: August 09, 1985 Referring Provider (PT): de [place], Blondell Reveal, MD   Encounter Date: 07/05/2020   PT End of Session - 07/05/20 0847     Visit Number 8    Number of Visits 15    Date for PT Re-Evaluation 07/26/20    Authorization Type [facility]    PT Start Time 0845    PT Stop Time 0927    PT Time Calculation (min) 42 min    Activity Tolerance Patient tolerated treatment well    Behavior During Therapy [facility] for tasks assessed/performed             History reviewed. No pertinent past medical history.  Past Surgical History:  Procedure Laterality Date   KNEE ARTHROSCOPY  1999   SPINE SURGERY  12/29/2005    There were no vitals filed for this visit.   Subjective Assessment - 07/05/20 0847     Subjective Overal significant improvement. L4-5, L5-S1 fusion scheduled with Dr Vertell Limber 8/9.    Currently in Pain? No/denies                               [facility] Adult PT Treatment/Exercise - 07/05/20 0001       Knee/Hip Exercises: Seated   Sit to Sand 15 reps;without UE support   dowel for hip hinge     Knee/Hip Exercises: Supine   Other Supine Knee/Hip Exercises transverse abdominis engagement                    PT Education - 07/05/20 1401     Education Details surgery & post op considerations, POC pre-surgery    Person(s) Educated Patient    Methods Explanation    Comprehension Verbalized understanding;Need further instruction                 PT Long Term Goals - 06/21/20 0850       PT LONG TERM GOAL #1   Title resolution of burning in Rt LE    Baseline occasional burning on lateral ankle but dissipates on its own    Status Partially Met      PT LONG TERM GOAL #2   Title pt will be able  to return to regular exercise    Baseline walking regularly but noted increased pain after running trial at PT; walking 20-30 min    Status On-going      PT LONG TERM GOAL #3   Title demo gross 5/5 hip strength for support to lumbopelvic region    Baseline see flowsheet    Status On-going      PT LONG TERM GOAL #4   Title lifting floor to waist height    Baseline will cont to educate    Time 5    Period Weeks    Status New    Target Date 07/26/20      PT LONG TERM GOAL #5   Title pusing/pulling for lawn mowing    Baseline will progress core stability & tolerance to these motions    Time 5    Period Weeks    Status New    Target Date 07/26/20  Plan - 07/05/20 0908     Clinical Impression Statement Majority of his appointment was discussing post op considerations and return to function. We did work on abdominal engagement vs exercise so he is able to practice and start those soon after surgery. Will continue pre-op at a frequency of 1/week to further progress strength and flexibility that will support outcomes.    PT Frequency 1x / week    PT Duration 6 weeks    PT Treatment/Interventions ADLs/Self Care Home Management;Cryotherapy;Aquatic Therapy;Electrical Stimulation;Gait training;Ultrasound;Traction;Moist Heat;Iontophoresis 6m/ml Dexamethasone;Stair training;Functional mobility training;Therapeutic activities;Therapeutic exercise;Neuromuscular re-education;Manual techniques;Patient/family education;Passive range of motion;Dry needling;Taping;Joint Manipulations;Spinal Manipulations    PT Next Visit Plan review pelvic tilts, cont hip hinge, qped    PT Home Exercise Plan [id number]    Consulted and Agree with Plan of Care Patient             Patient will benefit from skilled therapeutic intervention in order to improve the following deficits and impairments:  Decreased range of motion, Difficulty walking, Increased muscle spasms, Decreased activity  tolerance, Pain, Improper body mechanics, Impaired flexibility, Decreased strength, Postural dysfunction  Visit Diagnosis: Acute right-sided low back pain, unspecified whether sciatica present  Pain in right hip  Difficulty in walking, not elsewhere classified     Problem List Patient Active Problem List   Diagnosis Date Noted   Spinal stenosis at L4-L5 level 05/29/2020   Lumbosacral radiculopathy 05/23/2020   Low back pain 04/24/2020   Hip pain 04/24/2020   Strain of calf muscle 04/24/2020   Prediabetes 04/24/2020   Hyperlipidemia 04/24/2020   Transaminitis 04/24/2020    Chrissy Ealey C. Kyani Simkin PT, DPT 07/05/20 2:05 PM   [facility] [address] [address]Phone: [phone]  Fax:  [phone] Name: MTarique LoveallMRN: [id number] of Birth: [birth year]

## 2020-07-08 ENCOUNTER — Encounter (HOSPITAL_BASED_OUTPATIENT_CLINIC_OR_DEPARTMENT_OTHER): Payer: Self-pay | Admitting: Family Medicine

## 2020-07-08 DIAGNOSIS — Z832 Family history of diseases of the blood and blood-forming organs and certain disorders involving the immune mechanism: Secondary | ICD-10-CM

## 2020-07-09 ENCOUNTER — Other Ambulatory Visit: Payer: Self-pay

## 2020-07-09 ENCOUNTER — Ambulatory Visit (HOSPITAL_BASED_OUTPATIENT_CLINIC_OR_DEPARTMENT_OTHER): Payer: No Typology Code available for payment source | Admitting: Physical Therapy

## 2020-07-09 ENCOUNTER — Encounter (HOSPITAL_BASED_OUTPATIENT_CLINIC_OR_DEPARTMENT_OTHER): Payer: Self-pay | Admitting: Physical Therapy

## 2020-07-09 DIAGNOSIS — M545 Low back pain, unspecified: Secondary | ICD-10-CM

## 2020-07-09 DIAGNOSIS — M25551 Pain in right hip: Secondary | ICD-10-CM

## 2020-07-09 DIAGNOSIS — R262 Difficulty in walking, not elsewhere classified: Secondary | ICD-10-CM

## 2020-07-09 NOTE — Therapy (Signed)
[facility] [address] [place], [place], [address] Phone: [phone]   Fax:  [phone]  Physical Therapy Treatment  Patient Details  Name: Justin Morrow MRN: [id number] Date of Birth: [birth year] Referring Provider (PT): de [place], Blondell Reveal, MD   Encounter Date: 07/09/2020   PT End of Session - 07/09/20 0846     Visit Number 9    Number of Visits 15    Date for PT Re-Evaluation 07/26/20    Authorization Type [facility]    PT Start Time 0845    PT Stop Time 0923    PT Time Calculation (min) 38 min    Activity Tolerance Patient tolerated treatment well    Behavior During Therapy [facility] for tasks assessed/performed             History reviewed. No pertinent past medical history.  Past Surgical History:  Procedure Laterality Date   KNEE ARTHROSCOPY  1999   SPINE SURGERY  12/29/2005    There were no vitals filed for this visit.   Subjective Assessment - 07/09/20 0846     Subjective I pulled a muscle in my lower back- maybe from walking up a hill. tingling is much better than it has been.    Currently in Pain? No/denies                               [facility] Adult PT Treatment/Exercise - 07/09/20 0001       Therapeutic Activites    Therapeutic Activities Other Therapeutic Activities    Other Therapeutic Activities stairs, log rolling      Knee/Hip Exercises: Standing   Other Standing Knee Exercises fwd step ups and fwd step taps      Knee/Hip Exercises: Supine   Other Supine Knee/Hip Exercises ab set & ab set with marching      Manual Therapy   Soft tissue mobilization Rt gluts & piriformis                    PT Education - 07/09/20 0931     Education Details post op & caregiver PROM & mobility    Person(s) Educated Patient    Methods Explanation;Demonstration;Tactile cues;Verbal cues;Handout    Comprehension Verbalized understanding;Returned demonstration;Verbal cues  required;Tactile cues required;Need further instruction                 PT Long Term Goals - 06/21/20 0850       PT LONG TERM GOAL #1   Title resolution of burning in Rt LE    Baseline occasional burning on lateral ankle but dissipates on its own    Status Partially Met      PT LONG TERM GOAL #2   Title pt will be able to return to regular exercise    Baseline walking regularly but noted increased pain after running trial at PT; walking 20-30 min    Status On-going      PT LONG TERM GOAL #3   Title demo gross 5/5 hip strength for support to lumbopelvic region    Baseline see flowsheet    Status On-going      PT LONG TERM GOAL #4   Title lifting floor to waist height    Baseline will cont to educate    Time 5    Period Weeks    Status New    Target Date 07/26/20      PT  LONG TERM GOAL #5   Title pusing/pulling for lawn mowing    Baseline will progress core stability & tolerance to these motions    Time 5    Period Weeks    Status New    Target Date 07/26/20                   Plan - 07/09/20 0930     Clinical Impression Statement Continued discussions regarding post op prep with idea of "hope for the best, prepare for the worst"   does have to climb stairs and worked to train use of gluts and avoiding valgus collapse of knees. Improving awareness and control of abdominal engagement. Tightness in glut max and piriformis today reduced with STM.    PT Treatment/Interventions ADLs/Self Care Home Management;Cryotherapy;Aquatic Therapy;Electrical Stimulation;Gait training;Ultrasound;Traction;Moist Heat;Iontophoresis 44m/ml Dexamethasone;Stair training;Functional mobility training;Therapeutic activities;Therapeutic exercise;Neuromuscular re-education;Manual techniques;Patient/family education;Passive range of motion;Dry needling;Taping;Joint Manipulations;Spinal Manipulations    PT Next Visit Plan did he try stairs,cont core strength, qped    PT Home Exercise Plan  [id number], [id number] caregiver PROM    Consulted and Agree with Plan of Care Patient             Patient will benefit from skilled therapeutic intervention in order to improve the following deficits and impairments:  Decreased range of motion, Difficulty walking, Increased muscle spasms, Decreased activity tolerance, Pain, Improper body mechanics, Impaired flexibility, Decreased strength, Postural dysfunction  Visit Diagnosis: Acute right-sided low back pain, unspecified whether sciatica present  Pain in right hip  Difficulty in walking, not elsewhere classified     Problem List Patient Active Problem List   Diagnosis Date Noted   Spinal stenosis at L4-L5 level 05/29/2020   Lumbosacral radiculopathy 05/23/2020   Low back pain 04/24/2020   Hip pain 04/24/2020   Strain of calf muscle 04/24/2020   Prediabetes 04/24/2020   Hyperlipidemia 04/24/2020   Transaminitis 04/24/2020   Justin Morrow PT, DPT 07/09/20 10:06 AM  [facility] [address] [address]Phone: [phone]  Fax:  [phone] Name: Justin AltizerMRN: [id number] of Birth: 206/30/1987

## 2020-07-10 ENCOUNTER — Ambulatory Visit (INDEPENDENT_AMBULATORY_CARE_PROVIDER_SITE_OTHER): Payer: No Typology Code available for payment source | Admitting: Family Medicine

## 2020-07-10 DIAGNOSIS — Z832 Family history of diseases of the blood and blood-forming organs and certain disorders involving the immune mechanism: Secondary | ICD-10-CM

## 2020-07-10 NOTE — Progress Notes (Deleted)
   Subjective:    Patient ID: Justin Morrow, male    DOB: 10/02/1985, 35 y.o.   MRN: [id number]  HPI No concerns or complaints. No hx of vagal response during phlebotomy   Review of Systems    No changes Objective:   Physical Exam No changes       Assessment & Plan:   Pt presents for labs. Patient tolerated well

## 2020-07-10 NOTE — Progress Notes (Signed)
   Subjective:    Patient ID: Justin Morrow, male    DOB: Sep 28, 1985, 35 y.o.   MRN: [id number]  HPI  No concerns or complaints.   Review of Systems     Objective:   Physical Exam        Assessment & Plan:  Pt presents for labs. Pt tolerated well. PRN follow up

## 2020-07-16 LAB — FACTOR 5 LEIDEN

## 2020-07-19 ENCOUNTER — Ambulatory Visit (HOSPITAL_BASED_OUTPATIENT_CLINIC_OR_DEPARTMENT_OTHER): Payer: No Typology Code available for payment source | Attending: Family Medicine | Admitting: Physical Therapy

## 2020-07-19 ENCOUNTER — Encounter (HOSPITAL_BASED_OUTPATIENT_CLINIC_OR_DEPARTMENT_OTHER): Payer: Self-pay | Admitting: Physical Therapy

## 2020-07-19 ENCOUNTER — Other Ambulatory Visit: Payer: Self-pay

## 2020-07-19 DIAGNOSIS — M545 Low back pain, unspecified: Secondary | ICD-10-CM | POA: Diagnosis present

## 2020-07-19 DIAGNOSIS — R262 Difficulty in walking, not elsewhere classified: Secondary | ICD-10-CM | POA: Insufficient documentation

## 2020-07-19 DIAGNOSIS — R2689 Other abnormalities of gait and mobility: Secondary | ICD-10-CM | POA: Insufficient documentation

## 2020-07-19 DIAGNOSIS — M25551 Pain in right hip: Secondary | ICD-10-CM | POA: Insufficient documentation

## 2020-07-19 NOTE — Therapy (Signed)
[facility] [place], [place], [address] Phone: [phone]   Fax:  [phone]  Physical Therapy Treatment  Patient Details  Name: Justin Morrow MRN: [id number] Date of Birth: 07/27/1985 Referring Provider (PT): de [place], Blondell Reveal, MD   Encounter Date: 07/19/2020   PT End of Session - 07/19/20 1518     Visit Number 10    Number of Visits 15    Date for PT Re-Evaluation 07/26/20    Authorization Type [facility]    PT Start Time 9379    PT Stop Time 1540    PT Time Calculation (min) 23 min    Activity Tolerance Patient tolerated treatment well    Behavior During Therapy [facility] for tasks assessed/performed             History reviewed. No pertinent past medical history.  Past Surgical History:  Procedure Laterality Date   KNEE ARTHROSCOPY  1999   SPINE SURGERY  12/29/2005    There were no vitals filed for this visit.   Subjective Assessment - 07/19/20 1518     Subjective Feeling really good. surgery scheduled    Currently in Pain? No/denies                [facility] PT Assessment - 07/19/20 0001       Strength   Overall Strength Comments Lt hip abd & ext 5/5 with inability to hold greater than 5s      Special Tests   Other special tests Lt 45 deg -SLR; Rt 40 deg +SLR                           [facility] Adult PT Treatment/Exercise - 07/19/20 0001       Therapeutic Activites    Other Therapeutic Activities log rolling, brace donning.                    PT Education - 07/19/20 1933     Education Details importance of continued HEP    Person(s) Educated Patient    Methods Explanation    Comprehension Verbalized understanding                 PT Long Term Goals - 07/19/20 1531       PT LONG TERM GOAL #1   Title resolution of burning in Rt LE      PT LONG TERM GOAL #2   Title pt will be able to return to regular exercise    Baseline walking for  exercise    Status Achieved      PT LONG TERM GOAL #3   Title demo gross 5/5 hip strength for support to lumbopelvic region      PT LONG TERM GOAL #4   Title lifting floor to waist height    Status Achieved      PT LONG TERM GOAL #5   Title pusing/pulling for lawn mowing    Baseline able to do, limited due to lack of rain    Status Achieved                   Plan - 07/19/20 1936     Clinical Impression Statement pt has made significant progress since beginning PT and is prepared to go on hold until surgery. Continues to demo weakness in LE with +SLR indicating need for intervention but will be able to remain functional using HEP until that  time. Will touch base again just before surgery to review with pt and his wife and encouraged him to contact me with any further questions.    PT Treatment/Interventions ADLs/Self Care Home Management;Cryotherapy;Aquatic Therapy;Electrical Stimulation;Gait training;Ultrasound;Traction;Moist Heat;Iontophoresis 4mg /ml Dexamethasone;Stair training;Functional mobility training;Therapeutic activities;Therapeutic exercise;Neuromuscular re-education;Manual techniques;Patient/family education;Passive range of motion;Dry needling;Taping;Joint Manipulations;Spinal Manipulations    PT Home Exercise Plan [id number], [id number]- caregiver PROM    Consulted and Agree with Plan of Care Patient             Patient will benefit from skilled therapeutic intervention in order to improve the following deficits and impairments:  Decreased range of motion, Difficulty walking, Increased muscle spasms, Decreased activity tolerance, Pain, Improper body mechanics, Impaired flexibility, Decreased strength, Postural dysfunction  Visit Diagnosis: Acute right-sided low back pain, unspecified whether sciatica present  Pain in right hip  Difficulty in walking, not elsewhere classified  Other abnormalities of gait and mobility     Problem List Patient Active  Problem List   Diagnosis Date Noted   Spinal stenosis at L4-L5 level 05/29/2020   Lumbosacral radiculopathy 05/23/2020   Low back pain 04/24/2020   Hip pain 04/24/2020   Strain of calf muscle 04/24/2020   Prediabetes 04/24/2020   Hyperlipidemia 04/24/2020   Transaminitis 04/24/2020   Justin Morrow C. Samie Reasons PT, DPT 07/19/20 7:41 PM   [facility] [address] [place], [place], [address] Phone: [phone]   Fax:  [phone]  Name: Justin Morrow MRN: [id number] Date of Birth: 1985/05/28

## 2020-07-22 ENCOUNTER — Encounter (HOSPITAL_BASED_OUTPATIENT_CLINIC_OR_DEPARTMENT_OTHER): Payer: Self-pay

## 2020-07-22 ENCOUNTER — Ambulatory Visit (HOSPITAL_BASED_OUTPATIENT_CLINIC_OR_DEPARTMENT_OTHER): Payer: No Typology Code available for payment source | Admitting: Physical Therapy

## 2020-07-22 ENCOUNTER — Telehealth (HOSPITAL_BASED_OUTPATIENT_CLINIC_OR_DEPARTMENT_OTHER): Payer: Self-pay

## 2020-07-22 NOTE — Telephone Encounter (Signed)
-----   Message from Raymond J de [place]am, MD sent at 07/19/2020  1:33 PM EDT ----- Testing reveals that factor V Leiden is not present, thus there is no increased risk for clotting

## 2020-07-22 NOTE — Telephone Encounter (Signed)
Results released by Dr. de [place] and reviewed by patient via MyChart Instructed patient to contact the office with any questions or concerns.  

## 2020-07-25 ENCOUNTER — Encounter (HOSPITAL_BASED_OUTPATIENT_CLINIC_OR_DEPARTMENT_OTHER): Payer: Self-pay | Admitting: Physical Therapy

## 2020-07-30 ENCOUNTER — Encounter: Payer: No Typology Code available for payment source | Admitting: Dietician

## 2020-07-31 ENCOUNTER — Encounter (HOSPITAL_BASED_OUTPATIENT_CLINIC_OR_DEPARTMENT_OTHER): Payer: No Typology Code available for payment source | Admitting: Physical Therapy

## 2020-08-07 ENCOUNTER — Encounter (HOSPITAL_BASED_OUTPATIENT_CLINIC_OR_DEPARTMENT_OTHER): Payer: Self-pay | Admitting: Physical Therapy

## 2020-08-08 ENCOUNTER — Ambulatory Visit (HOSPITAL_BASED_OUTPATIENT_CLINIC_OR_DEPARTMENT_OTHER): Payer: No Typology Code available for payment source | Admitting: Physical Therapy

## 2020-08-08 ENCOUNTER — Other Ambulatory Visit: Payer: Self-pay

## 2020-08-08 ENCOUNTER — Encounter (HOSPITAL_BASED_OUTPATIENT_CLINIC_OR_DEPARTMENT_OTHER): Payer: Self-pay | Admitting: Physical Therapy

## 2020-08-08 DIAGNOSIS — M545 Low back pain, unspecified: Secondary | ICD-10-CM | POA: Diagnosis not present

## 2020-08-08 DIAGNOSIS — R2689 Other abnormalities of gait and mobility: Secondary | ICD-10-CM

## 2020-08-08 DIAGNOSIS — R262 Difficulty in walking, not elsewhere classified: Secondary | ICD-10-CM

## 2020-08-08 NOTE — Therapy (Signed)
[place], [place], [address] Phone: [phone]   Fax:  [phone]  Physical Therapy Treatment  Patient Details  Name: Justin Morrow MRN: [id number] Date of Birth: 04-Nov-1985 Referring Provider (PT): de [place], Blondell Reveal, MD   Encounter Date: 08/08/2020   PT End of Session - 08/08/20 1617     Visit Number 11    Number of Visits 23    Date for PT Re-Evaluation 09/21/20    Authorization Type [facility]    PT Start Time [id number]    PT Stop Time 1658    PT Time Calculation (min) 41 min    Activity Tolerance Patient tolerated treatment well    Behavior During Therapy [facility] for tasks assessed/performed             History reviewed. No pertinent past medical history.  Past Surgical History:  Procedure Laterality Date   KNEE ARTHROSCOPY  1999   SPINE SURGERY  12/29/2005    There were no vitals filed for this visit.   Subjective Assessment - 08/08/20 1618     Subjective occasional muscle pain in lower back. Denies parasthesias or foot drop.    How long can you sit comfortably? it's fine, I know I need to get up and move more    How long can you walk comfortably? 40 min    Patient Stated Goals return to PLOF, core strength, balance- ride mobile bike    Currently in Pain? No/denies                [facility] PT Assessment - 08/08/20 0001       Assessment   Medical Diagnosis LBP, Rt hip pain, Rt calf pain    Referring Provider (PT) de [place], Blondell Reveal, MD    Hand Dominance Left      Precautions   Precautions None      Restrictions   Weight Bearing Restrictions No      Balance Screen   Has the patient fallen in the past 6 months No      [place] residence    Living Arrangements Spouse/significant other;Children      Prior Function   Level of Independence Independent    Vocation Full time employment    Patent examiner       Cognition   Overall Cognitive Status Within Functional Limits for tasks assessed      Sensation   Additional Comments resolution of tingling      Posture/Postural Control   Posture Comments elevated Lt ASIS, bil ankle eversion in standing      Strength   Strength Assessment Site Hip    Right/Left Hip Right;Left    Right Hip Flexion 5/5    Right Hip Extension 5/5    Right Hip ABduction 4/5    Left Hip Flexion 5/5    Left Hip Extension 5/5    Left Hip ABduction 5/5      Flexibility   Soft Tissue Assessment /Muscle Length yes    Hamstrings Rt 50, Lt 50      Special Tests   Other special tests negative SLR bilat      High Level Balance   High Level Balance Comments Rt SLS decr ankle stability vs Lt; NBOS with head turns decr balance to Rt; poor stability on unstable surface  [facility] Adult PT Treatment/Exercise - 08/08/20 0001       Knee/Hip Exercises: Stretches   Passive Hamstring Stretch Limitations seated edge of bed      Knee/Hip Exercises: Standing   Other Standing Knee Exercises SLS with focus on weight distribution and posture through feet      Knee/Hip Exercises: Supine   Other Supine Knee/Hip Exercises ab set with cues to decr rib cage flare    Other Supine Knee/Hip Exercises ab set +90/90 lift      Knee/Hip Exercises: Prone   Other Prone Exercises knee/elbow plank quick review                    PT Education - 08/08/20 1732     Education Details goals, physical testing, POC, HEP    Person(s) Educated Patient    Methods Explanation;Demonstration;Tactile cues;Verbal cues;Handout    Comprehension Verbalized understanding;Returned demonstration;Verbal cues required;Tactile cues required;Need further instruction                 PT Long Term Goals - 08/08/20 1620       PT LONG TERM GOAL #1   Title resolution of burning in Rt LE    Status Achieved      PT LONG TERM GOAL #2   Title pt will be able  to return to running    Baseline has been a runner until his back pain began    Time 6    Period Weeks    Status Revised    Target Date 09/21/20      PT LONG TERM GOAL #3   Title demo gross 5/5 hip strength for support to lumbopelvic region    Baseline see flowsheet    Time 6    Period Weeks    Status New    Target Date 09/21/20      PT LONG TERM GOAL #4   Title lifting floor to waist height    Status Achieved      PT LONG TERM GOAL #5   Title pusing/pulling for lawn mowing    Status Achieved      Additional Long Term Goals   Additional Long Term Goals Yes      PT LONG TERM GOAL #6   Title pt will improve balance in order to safely ride a mobile road bike    Baseline unable today    Time 6    Period Weeks    Status New    Target Date 09/21/20                   Plan - 08/08/20 1724     Clinical Impression Statement Pt surgery has been cancelled at this time in light of improvements in function. Re-evaluation was performed today to alter POC and try to return PT to PLOF with h/o lumbar issues in mind. Notable balance difficulties when turning head to the right and h/o ear surgeries- would like to improve balance overall. Has significantly worn down lateral aspect of hind foot of shoes due to eversion posture at calcaneus with h/o multiple ankle sprains. Encouraged him to purchase new shoes before trying to run. Pt will benefit from skilled PT to address deficits an reach long term functional goals.    Personal Factors and Comorbidities Comorbidity 2;Time since onset of injury/illness/exacerbation    Comorbidities runner with h/o injuries, h/o spinal surgery    Examination-Activity Limitations Bed Mobility;Bend;Sit;Sleep;Stand;Lift;Squat;Stairs;Caring for Others;Carry;Locomotion Level    Examination-Participation Restrictions Occupation;Other;Yard Work;Cleaning  PT Frequency 2x / week    PT Duration 6 weeks    PT Treatment/Interventions ADLs/Self Care Home  Management;Cryotherapy;Aquatic Therapy;Electrical Stimulation;Gait training;Ultrasound;Traction;Moist Heat;Iontophoresis '4mg'$ /ml Dexamethasone;Stair training;Functional mobility training;Therapeutic activities;Therapeutic exercise;Neuromuscular re-education;Manual techniques;Patient/family education;Passive range of motion;Dry needling;Taping;Joint Manipulations;Spinal Manipulations;Balance training    PT Next Visit Plan ankle stability in [place] & tband strengthening in [place], review planks    PT Home Exercise Plan [id number]           no longer required: [id number] caregiver PROM    Consulted and Agree with Plan of Care Patient             Patient will benefit from skilled therapeutic intervention in order to improve the following deficits and impairments:  Decreased range of motion, Difficulty walking, Increased muscle spasms, Decreased activity tolerance, Pain, Improper body mechanics, Impaired flexibility, Decreased strength, Postural dysfunction, Decreased balance  Visit Diagnosis: Difficulty in walking, not elsewhere classified - Plan: PT plan of care cert/re-cert  Other abnormalities of gait and mobility - Plan: PT plan of care cert/re-cert  Acute right-sided low back pain, unspecified whether sciatica present - Plan: PT plan of care cert/re-cert     Problem List Patient Active Problem List   Diagnosis Date Noted   Spinal stenosis at L4-L5 level 05/29/2020   Lumbosacral radiculopathy 05/23/2020   Low back pain 04/24/2020   Hip pain 04/24/2020   Strain of calf muscle 04/24/2020   Prediabetes 04/24/2020   Hyperlipidemia 04/24/2020   Transaminitis 04/24/2020  Darivs Lunden C. Erle Guster PT, DPT 08/08/20 5:34 PM   [facility] [address] [place], [place], [address] Phone: [phone]   Fax:  [phone]  Name: Justin Morrow MRN: [id number] Date of Birth: [birth year]

## 2020-08-13 ENCOUNTER — Other Ambulatory Visit: Payer: Self-pay

## 2020-08-13 ENCOUNTER — Encounter (HOSPITAL_BASED_OUTPATIENT_CLINIC_OR_DEPARTMENT_OTHER): Payer: Self-pay | Admitting: Physical Therapy

## 2020-08-13 ENCOUNTER — Ambulatory Visit (HOSPITAL_BASED_OUTPATIENT_CLINIC_OR_DEPARTMENT_OTHER): Payer: No Typology Code available for payment source | Attending: Family Medicine | Admitting: Physical Therapy

## 2020-08-13 DIAGNOSIS — R262 Difficulty in walking, not elsewhere classified: Secondary | ICD-10-CM | POA: Diagnosis present

## 2020-08-13 DIAGNOSIS — M545 Low back pain, unspecified: Secondary | ICD-10-CM

## 2020-08-13 DIAGNOSIS — R2689 Other abnormalities of gait and mobility: Secondary | ICD-10-CM | POA: Diagnosis present

## 2020-08-13 NOTE — Therapy (Signed)
[facility] [address] [place], [place], [address] Phone: [phone]   Fax:  [phone]  Physical Therapy Treatment  Patient Details  Name: Justin Morrow MRN: [id number] Date of Birth: 1985/06/07 Referring Provider (PT): de [place]am, Blondell Reveal, MD   Encounter Date: 08/13/2020   PT End of Session - 08/13/20 0930     Visit Number 12    Number of Visits 23    Date for PT Re-Evaluation 09/21/20    Authorization Type [facility]    PT Start Time 0930    PT Stop Time 1013    PT Time Calculation (min) 43 min    Activity Tolerance Patient tolerated treatment well    Behavior During Therapy [facility] for tasks assessed/performed             History reviewed. No pertinent past medical history.  Past Surgical History:  Procedure Laterality Date   KNEE ARTHROSCOPY  1999   SPINE SURGERY  12/29/2005    There were no vitals filed for this visit.   Subjective Assessment - 08/13/20 0930     Subjective no pain    Currently in Pain? No/denies                               [facility] Adult PT Treatment/Exercise - 08/13/20 0001       Knee/Hip Exercises: Aerobic   Recumbent Bike 5 min L1 beg of session      Knee/Hip Exercises: Standing   Forward Step Up Limitations step up to just aerobics step with knee drive   cues to bring Rt hip fwd   Other Standing Knee Exercises TRX- squats, hip hinge, lateral lunges      Knee/Hip Exercises: Prone   Other Prone Exercises planks on bench- static, alt LE lift, alt hand lift                         PT Long Term Goals - 08/08/20 1620       PT LONG TERM GOAL #1   Title resolution of burning in Rt LE    Status Achieved      PT LONG TERM GOAL #2   Title pt will be able to return to running    Baseline has been a runner until his back pain began    Time 6    Period Weeks    Status Revised    Target Date 09/21/20      PT LONG TERM GOAL #3   Title demo  gross 5/5 hip strength for support to lumbopelvic region    Baseline see flowsheet    Time 6    Period Weeks    Status New    Target Date 09/21/20      PT LONG TERM GOAL #4   Title lifting floor to waist height    Status Achieved      PT LONG TERM GOAL #5   Title pusing/pulling for lawn mowing    Status Achieved      Additional Long Term Goals   Additional Long Term Goals Yes      PT LONG TERM GOAL #6   Title pt will improve balance in order to safely ride a mobile road bike    Baseline unable today    Time 6    Period Weeks    Status New    Target Date 09/21/20  Plan - 08/13/20 1253     Clinical Impression Statement Utilized TRX straps for hip hinge above and below with cues for weight placement in feet- tends to evert in Rt lateral lunge. able to find a good plank form with some cuing. Rt knee drive with Lt arm reach in step up required cues for oblique activation to pull hip fwd. Pt denied any radicular symptoms with treatment and will cont to progress as tolerated.    PT Treatment/Interventions ADLs/Self Care Home Management;Cryotherapy;Aquatic Therapy;Electrical Stimulation;Gait training;Ultrasound;Traction;Moist Heat;Iontophoresis '4mg'$ /ml Dexamethasone;Stair training;Functional mobility training;Therapeutic activities;Therapeutic exercise;Neuromuscular re-education;Manual techniques;Patient/family education;Passive range of motion;Dry needling;Taping;Joint Manipulations;Spinal Manipulations;Balance training    PT Next Visit Plan Rt LE balance, [place] ankle tband strengthening    PT Home Exercise Plan [id number]           no longer required: [id number]- caregiver PROM    Consulted and Agree with Plan of Care Patient             Patient will benefit from skilled therapeutic intervention in order to improve the following deficits and impairments:  Decreased range of motion, Difficulty walking, Increased muscle spasms, Decreased activity tolerance, Pain,  Improper body mechanics, Impaired flexibility, Decreased strength, Postural dysfunction, Decreased balance  Visit Diagnosis: Difficulty in walking, not elsewhere classified  Other abnormalities of gait and mobility  Acute right-sided low back pain, unspecified whether sciatica present     Problem List Patient Active Problem List   Diagnosis Date Noted   Spinal stenosis at L4-L5 level 05/29/2020   Lumbosacral radiculopathy 05/23/2020   Low back pain 04/24/2020   Hip pain 04/24/2020   Strain of calf muscle 04/24/2020   Prediabetes 04/24/2020   Hyperlipidemia 04/24/2020   Transaminitis 04/24/2020   Justin Morrow C. Melessa Cowell PT, DPT 08/13/20 12:58 PM   [facility] [address] [place], [place], [address] Phone: [phone]   Fax:  [phone]  Name: Justin Morrow MRN: [id number] Date of Birth: August 08, 1985

## 2020-08-14 ENCOUNTER — Encounter (HOSPITAL_BASED_OUTPATIENT_CLINIC_OR_DEPARTMENT_OTHER): Payer: Self-pay | Admitting: Physical Therapy

## 2020-08-19 ENCOUNTER — Ambulatory Visit (HOSPITAL_BASED_OUTPATIENT_CLINIC_OR_DEPARTMENT_OTHER): Payer: No Typology Code available for payment source | Admitting: Physical Therapy

## 2020-08-19 ENCOUNTER — Encounter (HOSPITAL_BASED_OUTPATIENT_CLINIC_OR_DEPARTMENT_OTHER): Payer: Self-pay | Admitting: Physical Therapy

## 2020-08-19 ENCOUNTER — Other Ambulatory Visit: Payer: Self-pay

## 2020-08-19 DIAGNOSIS — R2689 Other abnormalities of gait and mobility: Secondary | ICD-10-CM

## 2020-08-19 DIAGNOSIS — R262 Difficulty in walking, not elsewhere classified: Secondary | ICD-10-CM

## 2020-08-19 DIAGNOSIS — M545 Low back pain, unspecified: Secondary | ICD-10-CM

## 2020-08-19 NOTE — Therapy (Signed)
[facility] [place], [place], [address] Phone: [phone]   Fax:  [phone]  Physical Therapy Treatment  Patient Details  Name: Justin Morrow MRN: [id number] Date of Birth: 1985/04/20 Referring Provider (PT): de [place], Blondell Reveal, MD   Encounter Date: 08/19/2020   PT End of Session - 08/19/20 1601     Visit Number 13    Number of Visits 23    Date for PT Re-Evaluation 09/21/20    Authorization Type [facility]    PT Start Time 1601    PT Stop Time 1645    PT Time Calculation (min) 44 min    Activity Tolerance Patient tolerated treatment well    Behavior During Therapy [facility] for tasks assessed/performed             History reviewed. No pertinent past medical history.  Past Surgical History:  Procedure Laterality Date   KNEE ARTHROSCOPY  1999   SPINE SURGERY  12/29/2005    There were no vitals filed for this visit.   Subjective Assessment - 08/19/20 1601     Subjective Did a lot of holding toddler this weekend and noticed a tweak in my back this morning.    Patient Stated Goals return to PLOF, core strength, balance- ride mobile bike    Currently in Pain? No/denies                               [facility] Adult PT Treatment/Exercise - 08/19/20 0001       Knee/Hip Exercises: Standing   Heel Raises Limitations ball bw ankles    Hip Flexion Limitations single leg hinge reach to cone; added alt grab and reach of ball    Lateral Step Up Step Height: 2";Hand Hold: 0;Both   opp knee drive with opp UE reach   SLS with cone taps- fwd and lateral    Other Standing Knee Exercises wall plank with hop switch; with jax    Other Standing Knee Exercises side stepping yellow tband at feet      Manual Therapy   Joint Mobilization Rt SIJ PA grade 4    Soft tissue mobilization Rt piriformis, glut med                         PT Long Term Goals - 08/08/20 1620       PT LONG TERM  GOAL #1   Title resolution of burning in Rt LE    Status Achieved      PT LONG TERM GOAL #2   Title pt will be able to return to running    Baseline has been a runner until his back pain began    Time 6    Period Weeks    Status Revised    Target Date 09/21/20      PT LONG TERM GOAL #3   Title demo gross 5/5 hip strength for support to lumbopelvic region    Baseline see flowsheet    Time 6    Period Weeks    Status New    Target Date 09/21/20      PT LONG TERM GOAL #4   Title lifting floor to waist height    Status Achieved      PT LONG TERM GOAL #5   Title pusing/pulling for lawn mowing    Status Achieved      Additional Long  Term Goals   Additional Long Term Goals Yes      PT LONG TERM GOAL #6   Title pt will improve balance in order to safely ride a mobile road bike    Baseline unable today    Time 6    Period Weeks    Status New    Target Date 09/21/20                   Plan - 08/19/20 1657     Clinical Impression Statement focused on ankle control in balance and strengthening today. Ball used for stability in heel raises as Left foot fell quickly into eversion. Rt proximal stability decr vs Left. Pt denied pain and added 2 small plyometric exercises to begin incoorporating that.    PT Treatment/Interventions ADLs/Self Care Home Management;Cryotherapy;Aquatic Therapy;Electrical Stimulation;Gait training;Ultrasound;Traction;Moist Heat;Iontophoresis '4mg'$ /ml Dexamethasone;Stair training;Functional mobility training;Therapeutic activities;Therapeutic exercise;Neuromuscular re-education;Manual techniques;Patient/family education;Passive range of motion;Dry needling;Taping;Joint Manipulations;Spinal Manipulations;Balance training    PT Next Visit Plan core progressions    PT Home Exercise Plan [email or web address]           no longer required: [id number]- caregiver PROM    Consulted and Agree with Plan of Care Patient             Patient will benefit from skilled  therapeutic intervention in order to improve the following deficits and impairments:  Decreased range of motion, Difficulty walking, Increased muscle spasms, Decreased activity tolerance, Pain, Improper body mechanics, Impaired flexibility, Decreased strength, Postural dysfunction, Decreased balance  Visit Diagnosis: Difficulty in walking, not elsewhere classified  Other abnormalities of gait and mobility  Acute right-sided low back pain, unspecified whether sciatica present     Problem List Patient Active Problem List   Diagnosis Date Noted   Spinal stenosis at L4-L5 level 05/29/2020   Lumbosacral radiculopathy 05/23/2020   Low back pain 04/24/2020   Hip pain 04/24/2020   Strain of calf muscle 04/24/2020   Prediabetes 04/24/2020   Hyperlipidemia 04/24/2020   Transaminitis 04/24/2020   Rachael Ferrie C. Cenia Zaragosa PT, DPT 08/19/20 4:59 PM   [facility] [address] [place], [place], [address] Phone: [phone]   Fax:  [phone]  Name: Justin Morrow MRN: [id number] Date of Birth: December 17, 1985

## 2020-08-20 ENCOUNTER — Inpatient Hospital Stay: Admit: 2020-08-20 | Payer: No Typology Code available for payment source | Admitting: Neurosurgery

## 2020-08-20 SURGERY — POSTERIOR LUMBAR FUSION 2 LEVEL
Anesthesia: General | Site: Back

## 2020-08-26 ENCOUNTER — Ambulatory Visit (HOSPITAL_BASED_OUTPATIENT_CLINIC_OR_DEPARTMENT_OTHER): Payer: No Typology Code available for payment source | Admitting: Physical Therapy

## 2020-08-26 ENCOUNTER — Encounter (HOSPITAL_BASED_OUTPATIENT_CLINIC_OR_DEPARTMENT_OTHER): Payer: Self-pay | Admitting: Physical Therapy

## 2020-08-26 ENCOUNTER — Other Ambulatory Visit: Payer: Self-pay

## 2020-08-26 DIAGNOSIS — R262 Difficulty in walking, not elsewhere classified: Secondary | ICD-10-CM

## 2020-08-26 DIAGNOSIS — M545 Low back pain, unspecified: Secondary | ICD-10-CM

## 2020-08-26 DIAGNOSIS — R2689 Other abnormalities of gait and mobility: Secondary | ICD-10-CM

## 2020-08-26 NOTE — Therapy (Signed)
[facility] [place], [place], [address] Phone: [phone]   Fax:  [phone]  Physical Therapy Treatment  Patient Details  Name: Justin Morrow MRN: [id number] Date of Birth: 06/12/1985 Referring Provider (PT): de [place], Blondell Reveal, MD   Encounter Date: 08/26/2020   PT End of Session - 08/26/20 1643     Visit Number 14    Number of Visits 23    Date for PT Re-Evaluation 09/21/20    Authorization Type [facility]    PT Start Time 1600    PT Stop Time 1643    PT Time Calculation (min) 43 min    Activity Tolerance Patient tolerated treatment well    Behavior During Therapy [facility] for tasks assessed/performed             History reviewed. No pertinent past medical history.  Past Surgical History:  Procedure Laterality Date   KNEE ARTHROSCOPY  1999   SPINE SURGERY  12/29/2005    There were no vitals filed for this visit.   Subjective Assessment - 08/26/20 1600     Subjective Rt lower back feels tight. overall feel good.                               [facility] Adult PT Treatment/Exercise - 08/26/20 0001       Knee/Hip Exercises: Stretches   Passive Hamstring Stretch Limitations supine with strap      Knee/Hip Exercises: Standing   Other Standing Knee Exercises squat to tap table- added dowel for hip hinge      Manual Therapy   Joint Mobilization Rt LE LAD    Soft tissue mobilization bil glut med, bil piriformis                    PT Education - 08/26/20 1640     Education Details heel lift + POC with lift    Person(s) Educated Patient    Methods Explanation    Comprehension Verbalized understanding                 PT Long Term Goals - 08/08/20 1620       PT LONG TERM GOAL #1   Title resolution of burning in Rt LE    Status Achieved      PT LONG TERM GOAL #2   Title pt will be able to return to running    Baseline has been a runner until his back  pain began    Time 6    Period Weeks    Status Revised    Target Date 09/21/20      PT LONG TERM GOAL #3   Title demo gross 5/5 hip strength for support to lumbopelvic region    Baseline see flowsheet    Time 6    Period Weeks    Status New    Target Date 09/21/20      PT LONG TERM GOAL #4   Title lifting floor to waist height    Status Achieved      PT LONG TERM GOAL #5   Title pusing/pulling for lawn mowing    Status Achieved      Additional Long Term Goals   Additional Long Term Goals Yes      PT LONG TERM GOAL #6   Title pt will improve balance in order to safely ride a mobile road bike  Baseline unable today    Time 6    Period Weeks    Status New    Target Date 09/21/20                   Plan - 08/26/20 2046     Clinical Impression Statement Added 3-layer heel lift to Rt shoe today which decreased vaulting over Lt LE in gait. Discussed how to incoorporate exercises into his day especially when his daughter interrupts his planned workout time. will re-evaluate lift at next visit.    PT Treatment/Interventions ADLs/Self Care Home Management;Cryotherapy;Aquatic Therapy;Electrical Stimulation;Gait training;Ultrasound;Traction;Moist Heat;Iontophoresis '4mg'$ /ml Dexamethasone;Stair training;Functional mobility training;Therapeutic activities;Therapeutic exercise;Neuromuscular re-education;Manual techniques;Patient/family education;Passive range of motion;Dry needling;Taping;Joint Manipulations;Spinal Manipulations;Balance training    PT Next Visit Plan core progressions, outcome of heel lift?    PT Home Exercise Plan [email or web address]           no longer required: [id number]- caregiver PROM    Consulted and Agree with Plan of Care Patient             Patient will benefit from skilled therapeutic intervention in order to improve the following deficits and impairments:  Decreased range of motion, Difficulty walking, Increased muscle spasms, Decreased activity tolerance,  Pain, Improper body mechanics, Impaired flexibility, Decreased strength, Postural dysfunction, Decreased balance  Visit Diagnosis: Difficulty in walking, not elsewhere classified  Other abnormalities of gait and mobility  Acute right-sided low back pain, unspecified whether sciatica present     Problem List Patient Active Problem List   Diagnosis Date Noted   Spinal stenosis at L4-L5 level 05/29/2020   Lumbosacral radiculopathy 05/23/2020   Low back pain 04/24/2020   Hip pain 04/24/2020   Strain of calf muscle 04/24/2020   Prediabetes 04/24/2020   Hyperlipidemia 04/24/2020   Transaminitis 04/24/2020    Shon Indelicato C. Sharilyn Geisinger PT, DPT 08/26/20 8:55 PM  [facility] [address] [place], [place], [address] Phone: [phone]   Fax:  [phone]  Name: Justin Morrow MRN: [id number] Date of Birth: 1985-12-25

## 2020-08-27 ENCOUNTER — Encounter (HOSPITAL_BASED_OUTPATIENT_CLINIC_OR_DEPARTMENT_OTHER): Payer: Self-pay | Admitting: Physical Therapy

## 2020-09-02 ENCOUNTER — Encounter (HOSPITAL_BASED_OUTPATIENT_CLINIC_OR_DEPARTMENT_OTHER): Payer: Self-pay | Admitting: Physical Therapy

## 2020-09-02 ENCOUNTER — Ambulatory Visit (HOSPITAL_BASED_OUTPATIENT_CLINIC_OR_DEPARTMENT_OTHER): Payer: No Typology Code available for payment source | Admitting: Physical Therapy

## 2020-09-02 ENCOUNTER — Other Ambulatory Visit: Payer: Self-pay

## 2020-09-02 DIAGNOSIS — R2689 Other abnormalities of gait and mobility: Secondary | ICD-10-CM

## 2020-09-02 DIAGNOSIS — R262 Difficulty in walking, not elsewhere classified: Secondary | ICD-10-CM | POA: Diagnosis not present

## 2020-09-02 DIAGNOSIS — M545 Low back pain, unspecified: Secondary | ICD-10-CM

## 2020-09-02 NOTE — Therapy (Signed)
[facility] [address] [place], [place], [address] Phone: [phone]   Fax:  [phone]  Physical Therapy Treatment  Patient Details  Name: Justin Morrow MRN: [id number] Date of Birth: [birth year] Referring Provider (PT): de [place], Blondell Reveal, MD   Encounter Date: 09/02/2020   PT End of Session - 09/02/20 1604     Visit Number 15    Number of Visits 23    Date for PT Re-Evaluation 09/21/20    Authorization Type [facility]    PT Start Time 1603    PT Stop Time 1645    PT Time Calculation (min) 42 min    Activity Tolerance Patient tolerated treatment well    Behavior During Therapy [facility] for tasks assessed/performed             History reviewed. No pertinent past medical history.  Past Surgical History:  Procedure Laterality Date   KNEE ARTHROSCOPY  1999   SPINE SURGERY  12/29/2005    There were no vitals filed for this visit.   Subjective Assessment - 09/02/20 1604     Subjective pirifomris has gotten tighter to the point where I cannot sit.    Patient Stated Goals return to PLOF, core strength, balance- ride mobile bike    Currently in Pain? Yes    Pain Score 2    up to 7/10   Pain Location Buttocks    Pain Descriptors / Indicators Tightness    Aggravating Factors  sitting    Pain Relieving Factors standing                               [facility] Adult PT Treatment/Exercise - 09/02/20 0001       Knee/Hip Exercises: Seated   Long Arc Quad Left;10 reps    Knee/Hip Flexion HS curl red tband      Manual Therapy   Manual therapy comments skilled palpation and monitoring during TPDN    Soft tissue mobilization IASTM Lt hamstrings; selfapplied cross friction HS with tennis ball              Trigger Point Dry Needling - 09/02/20 0001     Piriformis Response Twitch response elicited;Palpable increased muscle length   Lt                 PT Education - 09/02/20 1948      Education Details anatomy of condition    Person(s) Educated Patient    Methods Explanation    Comprehension Verbalized understanding;Need further instruction                 PT Long Term Goals - 08/08/20 1620       PT LONG TERM GOAL #1   Title resolution of burning in Rt LE    Status Achieved      PT LONG TERM GOAL #2   Title pt will be able to return to running    Baseline has been a runner until his back pain began    Time 6    Period Weeks    Status Revised    Target Date 09/21/20      PT LONG TERM GOAL #3   Title demo gross 5/5 hip strength for support to lumbopelvic region    Baseline see flowsheet    Time 6    Period Weeks    Status New    Target Date 09/21/20  PT LONG TERM GOAL #4   Title lifting floor to waist height    Status Achieved      PT LONG TERM GOAL #5   Title pusing/pulling for lawn mowing    Status Achieved      Additional Long Term Goals   Additional Long Term Goals Yes      PT LONG TERM GOAL #6   Title pt will improve balance in order to safely ride a mobile road bike    Baseline unable today    Time 6    Period Weeks    Status New    Target Date 09/21/20                   Plan - 09/02/20 1659     Clinical Impression Statement discomfort consistent with hamstring strain of unknown origin- possible overstretching at last appointment, possible new bike setup. Insertional tendon pain at Lt proximal hamstrings and will focus on muscle belly STM & stretching.    PT Treatment/Interventions ADLs/Self Care Home Management;Cryotherapy;Aquatic Therapy;Electrical Stimulation;Gait training;Ultrasound;Traction;Moist Heat;Iontophoresis '4mg'$ /ml Dexamethasone;Stair training;Functional mobility training;Therapeutic activities;Therapeutic exercise;Neuromuscular re-education;Manual techniques;Patient/family education;Passive range of motion;Dry needling;Taping;Joint Manipulations;Spinal Manipulations;Balance training    PT Next Visit Plan  outcome of hamstrings.    PT Home Exercise Plan [email or web address]           no longer required: [id number]- caregiver PROM    Consulted and Agree with Plan of Care Patient             Patient will benefit from skilled therapeutic intervention in order to improve the following deficits and impairments:  Decreased range of motion, Difficulty walking, Increased muscle spasms, Decreased activity tolerance, Pain, Improper body mechanics, Impaired flexibility, Decreased strength, Postural dysfunction, Decreased balance  Visit Diagnosis: Difficulty in walking, not elsewhere classified  Other abnormalities of gait and mobility  Acute right-sided low back pain, unspecified whether sciatica present     Problem List Patient Active Problem List   Diagnosis Date Noted   Spinal stenosis at L4-L5 level 05/29/2020   Lumbosacral radiculopathy 05/23/2020   Low back pain 04/24/2020   Hip pain 04/24/2020   Strain of calf muscle 04/24/2020   Prediabetes 04/24/2020   Hyperlipidemia 04/24/2020   Transaminitis 04/24/2020  Cheryel Kyte C. Jennavie Martinek PT, DPT 09/02/20 7:49 PM   [facility] [address] [place], [place], [address] Phone: [phone]   Fax:  [phone]  Name: Justin Morrow MRN: [id number] Date of Birth: [birth year]

## 2020-09-10 ENCOUNTER — Other Ambulatory Visit: Payer: Self-pay

## 2020-09-10 ENCOUNTER — Encounter (HOSPITAL_BASED_OUTPATIENT_CLINIC_OR_DEPARTMENT_OTHER): Payer: Self-pay | Admitting: Physical Therapy

## 2020-09-10 ENCOUNTER — Ambulatory Visit (HOSPITAL_BASED_OUTPATIENT_CLINIC_OR_DEPARTMENT_OTHER): Payer: No Typology Code available for payment source | Admitting: Physical Therapy

## 2020-09-10 DIAGNOSIS — R2689 Other abnormalities of gait and mobility: Secondary | ICD-10-CM

## 2020-09-10 DIAGNOSIS — R262 Difficulty in walking, not elsewhere classified: Secondary | ICD-10-CM | POA: Diagnosis not present

## 2020-09-10 DIAGNOSIS — M545 Low back pain, unspecified: Secondary | ICD-10-CM

## 2020-09-10 NOTE — Therapy (Signed)
[facility] [address] [place], [place], [address] Phone: [phone]   Fax:  [phone]  Physical Therapy Treatment  Patient Details  Name: Jeremai Russin MRN: [id number] Date of Birth: April 19, 1985 Referring Provider (PT): de [place], Blondell Reveal, MD   Encounter Date: 09/10/2020   PT End of Session - 09/10/20 1601     Visit Number 16    Number of Visits 23    Date for PT Re-Evaluation 09/21/20    Authorization Type [facility]    PT Start Time 1600    PT Stop Time 1645    PT Time Calculation (min) 45 min    Activity Tolerance Patient tolerated treatment well    Behavior During Therapy [facility] for tasks assessed/performed             History reviewed. No pertinent past medical history.  Past Surgical History:  Procedure Laterality Date   KNEE ARTHROSCOPY  1999   SPINE SURGERY  12/29/2005    There were no vitals filed for this visit.   Subjective Assessment - 09/10/20 1601     Subjective HS soreness has been retreating and I can sit now. Soreness mostly in proximal HS/distal gluts.    Patient Stated Goals return to PLOF, core strength, balance- ride mobile bike                [facility] PT Assessment - 09/10/20 0001       Strength   Right Hip ABduction 4+/5      Special Tests   Other special tests negative SLR bilat                           [facility] Adult PT Treatment/Exercise - 09/10/20 0001       Knee/Hip Exercises: Standing   Lateral Step Up Limitations eccentric step down 4    Gait Training trunk rotation- adding slight hip hinge    Other Standing Knee Exercises hip hike on airex    Other Standing Knee Exercises runner lunge                         PT Long Term Goals - 08/08/20 1620       PT LONG TERM GOAL #1   Title resolution of burning in Rt LE    Status Achieved      PT LONG TERM GOAL #2   Title pt will be able to return to running    Baseline has been a  runner until his back pain began    Time 6    Period Weeks    Status Revised    Target Date 09/21/20      PT LONG TERM GOAL #3   Title demo gross 5/5 hip strength for support to lumbopelvic region    Baseline see flowsheet    Time 6    Period Weeks    Status New    Target Date 09/21/20      PT LONG TERM GOAL #4   Title lifting floor to waist height    Status Achieved      PT LONG TERM GOAL #5   Title pusing/pulling for lawn mowing    Status Achieved      Additional Long Term Goals   Additional Long Term Goals Yes      PT LONG TERM GOAL #6   Title pt will improve balance in order to safely ride  a mobile road bike    Baseline unable today    Time 6    Period Weeks    Status New    Target Date 09/21/20                   Plan - 09/10/20 1655     Clinical Impression Statement Rt hip abd strength cont to be a limiting factor and was the focus of todays treatment. Added slight hip hinge to power walk and runner stance to engage core, encourage trunk rotation and decr tension along spine to maintain an upright posture that he is used to. Reported noticing the difference in ankle stability with changes.    PT Treatment/Interventions ADLs/Self Care Home Management;Cryotherapy;Aquatic Therapy;Electrical Stimulation;Gait training;Ultrasound;Traction;Moist Heat;Iontophoresis '4mg'$ /ml Dexamethasone;Stair training;Functional mobility training;Therapeutic activities;Therapeutic exercise;Neuromuscular re-education;Manual techniques;Patient/family education;Passive range of motion;Dry needling;Taping;Joint Manipulations;Spinal Manipulations;Balance training    PT Next Visit Plan re-eval-extend  POC & add goals for running/plyo    PT Home Exercise Plan [email or web address]           no longer required: [id number]- caregiver PROM    Consulted and Agree with Plan of Care Patient             Patient will benefit from skilled therapeutic intervention in order to improve the following deficits and  impairments:  Decreased range of motion, Difficulty walking, Increased muscle spasms, Decreased activity tolerance, Pain, Improper body mechanics, Impaired flexibility, Decreased strength, Postural dysfunction, Decreased balance  Visit Diagnosis: Difficulty in walking, not elsewhere classified  Other abnormalities of gait and mobility  Acute right-sided low back pain, unspecified whether sciatica present     Problem List Patient Active Problem List   Diagnosis Date Noted   Spinal stenosis at L4-L5 level 05/29/2020   Lumbosacral radiculopathy 05/23/2020   Low back pain 04/24/2020   Hip pain 04/24/2020   Strain of calf muscle 04/24/2020   Prediabetes 04/24/2020   Hyperlipidemia 04/24/2020   Transaminitis 04/24/2020   Matti Minney C. Aadan Chenier PT, DPT 09/10/20 4:57 PM  [facility] [address] [place], [place], [address] Phone: [phone]   Fax:  [phone]  Name: Jodan Baptist MRN: [id number] Date of Birth: Oct 25, 1985

## 2020-09-11 ENCOUNTER — Ambulatory Visit (HOSPITAL_BASED_OUTPATIENT_CLINIC_OR_DEPARTMENT_OTHER): Payer: No Typology Code available for payment source | Admitting: Physical Therapy

## 2020-09-18 ENCOUNTER — Other Ambulatory Visit: Payer: Self-pay

## 2020-09-18 ENCOUNTER — Ambulatory Visit (HOSPITAL_BASED_OUTPATIENT_CLINIC_OR_DEPARTMENT_OTHER): Payer: No Typology Code available for payment source | Attending: Family Medicine | Admitting: Physical Therapy

## 2020-09-18 ENCOUNTER — Encounter (HOSPITAL_BASED_OUTPATIENT_CLINIC_OR_DEPARTMENT_OTHER): Payer: Self-pay | Admitting: Physical Therapy

## 2020-09-18 DIAGNOSIS — R262 Difficulty in walking, not elsewhere classified: Secondary | ICD-10-CM | POA: Diagnosis not present

## 2020-09-18 DIAGNOSIS — M545 Low back pain, unspecified: Secondary | ICD-10-CM | POA: Diagnosis present

## 2020-09-18 DIAGNOSIS — R2689 Other abnormalities of gait and mobility: Secondary | ICD-10-CM

## 2020-09-18 NOTE — Therapy (Signed)
[place], [place], [address] Phone: [phone]   Fax:  [phone]  Physical Therapy Treatment  Patient Details  Name: Justin Morrow MRN: [id number] Date of Birth: 1985-08-09 Referring Provider (PT): de [place], Blondell Reveal, MD   Encounter Date: 09/18/2020   PT End of Session - 09/18/20 1611     Visit Number 17    Number of Visits 23    Date for PT Re-Evaluation 11/01/20    Authorization Type [facility]    PT Start Time 1601    PT Stop Time 1645    PT Time Calculation (min) 44 min    Activity Tolerance Patient tolerated treatment well    Behavior During Therapy [facility] for tasks assessed/performed             History reviewed. No pertinent past medical history.  Past Surgical History:  Procedure Laterality Date   KNEE ARTHROSCOPY  1999   SPINE SURGERY  12/29/2005    There were no vitals filed for this visit.   Subjective Assessment - 09/18/20 1603     Subjective I think the root issue of the HS is bike-related. Occasional flare of Rt QL/ buttock region. Did about 12 miles of power walking/biking this week.    Patient Stated Goals return to PLOF, core strength, balance- ride mobile bike    Currently in Pain? No/denies                [facility] PT Assessment - 09/18/20 0001       Assessment   Medical Diagnosis LBP, Rt hip pain, Rt calf pain    Referring Provider (PT) de [place], Blondell Reveal, MD    Hand Dominance Left      Posture/Postural Control   Posture Comments Rt calcaneal inversion without shoes, eversion with arch support in shoes      Strength   Right Hip ABduction 4+/5      Flexibility   Hamstrings limited hamstring flexibility bilaterally      Special Tests   Other special tests negative SLR bilat      Ambulation/Gait   Gait Comments lateral heel strike on Rt                           [facility] Adult PT Treatment/Exercise - 09/18/20 0001       Knee/Hip  Exercises: Stretches   Passive Hamstring Stretch Limitations supine with strap    Piriformis Stretch Limitations seated figure 4      Knee/Hip Exercises: Machines for Strengthening   Other Machine cycle- postural alignment & activation      Knee/Hip Exercises: Plyometrics   Other Plyometric Exercises squat to heel raise prog to mini hops      Knee/Hip Exercises: Standing   Gait Training jogging- incr upper body rotation, longer strides                     PT Education - 09/18/20 1929     Education Details goals, progressing endurance in walking and running    Person(s) Educated Patient    Methods Explanation;Verbal cues;Handout    Comprehension Verbalized understanding;Need further instruction                 PT Long Term Goals - 09/18/20 1633       PT LONG TERM GOAL #1   Title resolution of burning in Rt LE    Status Achieved  PT LONG TERM GOAL #2   Title pt will be able to return to running    Baseline has been a runner until his back pain began, began reintroducing running today    Status On-going      PT LONG TERM GOAL #3   Title demo gross 5/5 hip strength for support to lumbopelvic region    Baseline see flowsheet    Status On-going      PT LONG TERM GOAL #4   Title lifting floor to waist height    Status Achieved      PT LONG TERM GOAL #5   Title pusing/pulling for lawn mowing    Status Achieved      PT LONG TERM GOAL #6   Title pt will improve balance in order to safely ride a mobile road bike    Baseline began with bike stationary, has some hamstring tightness but working on posture.    Status On-going                   Plan - 09/18/20 1929     Clinical Impression Statement Pt is doing very well progressing endurance in walking with limit at about 30 min right now. utilized track straight-aways for short bursts of jogging- working to lengthen stride. Notable difficulty with smooth transition from landing a plyometric  motion to absorbing momentum in squat. Added squat-heel raise motion for prep to progress plyometrics. POC extended in order to continue addressing high level activities and meet long term goals.    PT Frequency 1x / week    PT Duration 6 weeks    PT Treatment/Interventions ADLs/Self Care Home Management;Cryotherapy;Aquatic Therapy;Electrical Stimulation;Gait training;Ultrasound;Traction;Moist Heat;Iontophoresis '4mg'$ /ml Dexamethasone;Stair training;Functional mobility training;Therapeutic activities;Therapeutic exercise;Neuromuscular re-education;Manual techniques;Patient/family education;Passive range of motion;Dry needling;Taping;Joint Manipulations;Spinal Manipulations;Balance training    PT Next Visit Plan outcome of running? cont plyo progressions    PT Home Exercise Plan [id number]    Consulted and Agree with Plan of Care Patient             Patient will benefit from skilled therapeutic intervention in order to improve the following deficits and impairments:  Decreased range of motion, Difficulty walking, Increased muscle spasms, Decreased activity tolerance, Pain, Improper body mechanics, Impaired flexibility, Decreased strength, Postural dysfunction, Decreased balance  Visit Diagnosis: Difficulty in walking, not elsewhere classified  Other abnormalities of gait and mobility  Acute right-sided low back pain, unspecified whether sciatica present     Problem List Patient Active Problem List   Diagnosis Date Noted   Spinal stenosis at L4-L5 level 05/29/2020   Lumbosacral radiculopathy 05/23/2020   Low back pain 04/24/2020   Hip pain 04/24/2020   Strain of calf muscle 04/24/2020   Prediabetes 04/24/2020   Hyperlipidemia 04/24/2020   Transaminitis 04/24/2020  Crit Obremski C. Saurav Crumble PT, DPT 09/18/20 7:40 PM   [facility] [address] [place], [place], [address] Phone: [phone]   Fax:  [phone]  Name: Justin Morrow MRN: [id number] Date of Birth: April 13, 1985

## 2020-09-25 ENCOUNTER — Ambulatory Visit (HOSPITAL_BASED_OUTPATIENT_CLINIC_OR_DEPARTMENT_OTHER): Payer: No Typology Code available for payment source | Admitting: Physical Therapy

## 2020-09-25 ENCOUNTER — Other Ambulatory Visit: Payer: Self-pay

## 2020-09-25 ENCOUNTER — Encounter (HOSPITAL_BASED_OUTPATIENT_CLINIC_OR_DEPARTMENT_OTHER): Payer: Self-pay | Admitting: Physical Therapy

## 2020-09-25 DIAGNOSIS — R262 Difficulty in walking, not elsewhere classified: Secondary | ICD-10-CM

## 2020-09-25 DIAGNOSIS — M545 Low back pain, unspecified: Secondary | ICD-10-CM

## 2020-09-25 DIAGNOSIS — R2689 Other abnormalities of gait and mobility: Secondary | ICD-10-CM

## 2020-09-25 NOTE — Therapy (Signed)
[place] [address] [place], [place], [address] Phone: [phone]   Fax:  [phone]  Physical Therapy Treatment  Patient Details  Name: Justin Morrow MRN: [id number] Date of Birth: [birth year] Referring Provider (PT): de [place], Blondell Reveal, MD   Encounter Date: 09/25/2020   PT End of Session - 09/25/20 1601     Visit Number 18    Number of Visits 23    Date for PT Re-Evaluation 11/01/20    Authorization Type [facility]    PT Start Time 1600    PT Stop Time 1642    PT Time Calculation (min) 42 min    Activity Tolerance Patient tolerated treatment well    Behavior During Therapy [facility] for tasks assessed/performed             History reviewed. No pertinent past medical history.  Past Surgical History:  Procedure Laterality Date   KNEE ARTHROSCOPY  1999   SPINE SURGERY  12/29/2005    There were no vitals filed for this visit.   Subjective Assessment - 09/25/20 1602     Subjective Felt fine after running in session. Ran on Sat and felt fine, sunday did bike and felt fine until 1230pm and Lt hamstring began hurting.    Patient Stated Goals return to PLOF, core strength, balance- ride mobile bike    Currently in Pain? Yes    Pain Location Leg   hamstrings   Pain Descriptors / Indicators Tightness    Aggravating Factors  sitting    Pain Relieving Factors standing up                               [facility] Adult PT Treatment/Exercise - 09/25/20 0001       Knee/Hip Exercises: Stretches   Passive Hamstring Stretch Limitations seated edge of bed    Other Knee/Hip Stretches adductors: straddle, single leg abd/ER, standing lunge      Manual Therapy   Soft tissue mobilization IASTM & STM to bil HS and Lt adductors                     PT Education - 09/25/20 1930     Education Details shoes, bike, running & anatomy of condition, rationale for aquatic    Person(s) Educated Patient     Methods Explanation    Comprehension Verbalized understanding;Need further instruction                 PT Long Term Goals - 09/18/20 1633       PT LONG TERM GOAL #1   Title resolution of burning in Rt LE    Status Achieved      PT LONG TERM GOAL #2   Title pt will be able to return to running    Baseline has been a runner until his back pain began, began reintroducing running today    Status On-going      PT LONG TERM GOAL #3   Title demo gross 5/5 hip strength for support to lumbopelvic region    Baseline see flowsheet    Status On-going      PT LONG TERM GOAL #4   Title lifting floor to waist height    Status Achieved      PT LONG TERM GOAL #5   Title pusing/pulling for lawn mowing    Status Achieved      PT LONG TERM GOAL #  6   Title pt will improve balance in order to safely ride a mobile road bike    Baseline began with bike stationary, has some hamstring tightness but working on posture.    Status On-going                   Plan - 09/25/20 1927     Clinical Impression Statement Spasm in hamstrings and adductors with hamstring tightness notable bilaterally. Pt had incr pain when sitting after walking with shoes on and did not have pain after walking without shoes. Lateral heel support is very worn down and we discussed considering new shoes to decrease strain from eversion at heel strike. Will consider moving to aquatics for run training and will stay off of his bike for a bit. Asked him to contact me tomorrow if pain has not come down. will evaluate outcomes at next visit and progress as appropriate.    PT Treatment/Interventions ADLs/Self Care Home Management;Cryotherapy;Aquatic Therapy;Electrical Stimulation;Gait training;Ultrasound;Traction;Moist Heat;Iontophoresis '4mg'$ /ml Dexamethasone;Stair training;Functional mobility training;Therapeutic activities;Therapeutic exercise;Neuromuscular re-education;Manual techniques;Patient/family education;Passive  range of motion;Dry needling;Taping;Joint Manipulations;Spinal Manipulations;Balance training    PT Next Visit Plan plyo progressions if appropriate. did he get new shoes?    PT Home Exercise Plan [id number]    Consulted and Agree with Plan of Care Patient             Patient will benefit from skilled therapeutic intervention in order to improve the following deficits and impairments:  Decreased range of motion, Difficulty walking, Increased muscle spasms, Decreased activity tolerance, Pain, Improper body mechanics, Impaired flexibility, Decreased strength, Postural dysfunction, Decreased balance  Visit Diagnosis: Difficulty in walking, not elsewhere classified  Other abnormalities of gait and mobility  Acute right-sided low back pain, unspecified whether sciatica present     Problem List Patient Active Problem List   Diagnosis Date Noted   Spinal stenosis at L4-L5 level 05/29/2020   Lumbosacral radiculopathy 05/23/2020   Low back pain 04/24/2020   Hip pain 04/24/2020   Strain of calf muscle 04/24/2020   Prediabetes 04/24/2020   Hyperlipidemia 04/24/2020   Transaminitis 04/24/2020  Justin Morrow PT, DPT 09/25/20 7:33 PM  [facility] [address] [place], [place], [address] Phone: [phone]   Fax:  [phone]  Name: Justin Morrow MRN: [id number] Date of Birth: 23-Nov-1985

## 2020-09-27 ENCOUNTER — Other Ambulatory Visit (HOSPITAL_BASED_OUTPATIENT_CLINIC_OR_DEPARTMENT_OTHER): Payer: Self-pay | Admitting: Physical Therapy

## 2020-10-08 ENCOUNTER — Ambulatory Visit (INDEPENDENT_AMBULATORY_CARE_PROVIDER_SITE_OTHER): Payer: No Typology Code available for payment source | Admitting: Family Medicine

## 2020-10-08 ENCOUNTER — Ambulatory Visit (HOSPITAL_BASED_OUTPATIENT_CLINIC_OR_DEPARTMENT_OTHER): Payer: No Typology Code available for payment source | Admitting: Physical Therapy

## 2020-10-08 ENCOUNTER — Encounter (HOSPITAL_BASED_OUTPATIENT_CLINIC_OR_DEPARTMENT_OTHER): Payer: Self-pay | Admitting: Physical Therapy

## 2020-10-08 ENCOUNTER — Encounter (HOSPITAL_BASED_OUTPATIENT_CLINIC_OR_DEPARTMENT_OTHER): Payer: Self-pay | Admitting: Family Medicine

## 2020-10-08 ENCOUNTER — Other Ambulatory Visit: Payer: Self-pay

## 2020-10-08 DIAGNOSIS — M7072 Other bursitis of hip, left hip: Secondary | ICD-10-CM | POA: Diagnosis not present

## 2020-10-08 DIAGNOSIS — M545 Low back pain, unspecified: Secondary | ICD-10-CM

## 2020-10-08 DIAGNOSIS — R2689 Other abnormalities of gait and mobility: Secondary | ICD-10-CM

## 2020-10-08 DIAGNOSIS — R262 Difficulty in walking, not elsewhere classified: Secondary | ICD-10-CM | POA: Diagnosis not present

## 2020-10-08 MED ORDER — NAPROXEN SODIUM 550 MG PO TABS
550.0000 mg | ORAL_TABLET | Freq: Two times a day (BID) | ORAL | 1 refills | Status: DC
Start: 2020-10-08 — End: 2021-04-28

## 2020-10-08 NOTE — Assessment & Plan Note (Signed)
Patient reports that several weeks ago he began to develop left gluteal pain.  Pain is primarily worse with sitting on affected side, some pain with activities, particular with running or cycling.  Occasionally has pain with walking, but not all of the time No notable radiation of symptoms down the leg. Has been working with physical therapy regarding back pain and they have also been working with him some regarding this new onset gluteal/proximal hamstring pain.  Initially had some slight improvement, but more recently it has been unchanged for a few weeks.  Has been icing the area with some relief.  Not using any medications.  Has tried adjusting his seat cushion to relieve pressure over the area, but continues to have some symptoms On exam, normal range of motion at the hip with no pain with active or passive ROM.  Normal strength testing, particularly with hip extension, no significant pain at ischial tuberosity.  No significant pain with resisted knee flexion.  There is reproduced tenderness to palpation with palpation over ischial tuberosity, inferior to gluteus. Symptoms seem most consistent with ischial bursitis, less likely related to hamstring tendon pathology Discussed that given symptoms are mostly at rest, particularly when seated, this increases likelihood of bursitis as underlying cause Recommend continue with conservative treatment including adjusting seat cushion to allow for reduced pressure over area, use of anti-inflammatory medication, prescription sent Recommend continue with PT Can continue with activity and exercises as tolerated, discussed appropriate measures to ensure that he is not overexerting or pushing through pain too much Plan for follow-up as needed, if symptoms or not improving in about 3 to 4 weeks, can consider evaluating further with imaging

## 2020-10-08 NOTE — Patient Instructions (Signed)
  Medication Instructions:  Your physician has recommended you make the following change in your medication:  --START Naproxen 550 mg - Take 1 tablet by mouth twice daily with a small meal --If you need a refill on any your medications before your next appointment, please call your pharmacy first. If no refills are authorized on file call the office.-- Follow-Up: Your next appointment:   Your physician recommends that you schedule a follow-up appointment in: as needed if symptoms worsen with Dr. de [place]  You will receive a text message or e-mail with a link to a survey about your care and experience with [place] today! We would greatly appreciate your feedback!   Thanks for letting us be apart of your health journey!!  Primary Care and Sports Medicine   Dr. Arlina Robes [place]   We encourage you to activate your patient portal called "MyChart".  Sign up information is provided on this After Visit Summary.  MyChart is used to connect with patients for Virtual Visits (Telemedicine).  Patients are able to view lab/test results, encounter notes, upcoming appointments, etc.  Non-urgent messages can be sent to your provider as well. To learn more about what you can do with MyChart, please visit --  [email or web address].

## 2020-10-08 NOTE — Progress Notes (Signed)
    Procedures performed today:    None.  Independent interpretation of notes and tests performed by another provider:   None.  Brief History, Exam, Impression, and Recommendations:    BP 116/76   Pulse 67   Ht 5\' 6"  (1.676 m)   Wt 143 lb 3.2 oz (65 kg)   SpO2 98%   BMI 23.11 kg/m   Ischial bursitis of left side Patient reports that several weeks ago he began to develop left gluteal pain.  Pain is primarily worse with sitting on affected side, some pain with activities, particular with running or cycling.  Occasionally has pain with walking, but not all of the time No notable radiation of symptoms down the leg. Has been working with physical therapy regarding back pain and they have also been working with him some regarding this new onset gluteal/proximal hamstring pain.  Initially had some slight improvement, but more recently it has been unchanged for a few weeks.  Has been icing the area with some relief.  Not using any medications.  Has tried adjusting his seat cushion to relieve pressure over the area, but continues to have some symptoms On exam, normal range of motion at the hip with no pain with active or passive ROM.  Normal strength testing, particularly with hip extension, no significant pain at ischial tuberosity.  No significant pain with resisted knee flexion.  There is reproduced tenderness to palpation with palpation over ischial tuberosity, inferior to gluteus. Symptoms seem most consistent with ischial bursitis, less likely related to hamstring tendon pathology Discussed that given symptoms are mostly at rest, particularly when seated, this increases likelihood of bursitis as underlying cause Recommend continue with conservative treatment including adjusting seat cushion to allow for reduced pressure over area, use of anti-inflammatory medication, prescription sent Recommend continue with PT Can continue with activity and exercises as tolerated, discussed appropriate  measures to ensure that he is not overexerting or pushing through pain too much Plan for follow-up as needed, if symptoms or not improving in about 3 to 4 weeks, can consider evaluating further with imaging    ___________________________________________ Sean Macwilliams de [place], MD, ABFM, CAQSM Primary [facility] and [place]

## 2020-10-08 NOTE — Therapy (Signed)
[place], [place], [address] Phone: [phone]   Fax:  [phone]  Physical Therapy Treatment  Patient Details  Name: Justin Morrow MRN: [id number] Date of Birth: 1985-10-06 Referring Provider (PT): de [place], Blondell Reveal, MD   Encounter Date: 10/08/2020   PT End of Session - 10/08/20 1432     Visit Number 19    Number of Visits 23    Date for PT Re-Evaluation 11/01/20    Authorization Type [facility]OC[facility]    PT Start Time 1431    PT Stop Time 1514    PT Time Calculation (min) 43 min    Activity Tolerance Patient tolerated treatment well    Behavior During Therapy [facility] for tasks assessed/performed             History reviewed. No pertinent past medical history.  Past Surgical History:  Procedure Laterality Date   KNEE ARTHROSCOPY  1999   SPINE SURGERY  12/29/2005    There were no vitals filed for this visit.   Subjective Assessment - 10/08/20 1929     Subjective The pain just did not seem to come down after last time. I am having a hard time sitting again.    Patient Stated Goals return to PLOF, core strength, balance- ride mobile bike    Currently in Pain? Yes    Pain Location Buttocks    Pain Orientation Left    Pain Descriptors / Indicators Sore    Aggravating Factors  sitting    Pain Relieving Factors stand up                               [facility] Adult PT Treatment/Exercise - 10/08/20 0001       Manual Therapy   Manual therapy comments skilled palpation and monitoring during TPDN    Joint Mobilization Lumbar Lt unilateral PA, lumbar rt to lt sideglide with QL stretch    Soft tissue mobilization IASTM Lt HS, STM Lt gluts, Lt lumbar paraspinals              Trigger Point Dry Needling - 10/08/20 0001     Gluteus Medius Response Twitch response elicited;Palpable increased muscle length   Lt   Piriformis Response Twitch response elicited;Palpable increased  muscle length   Lt                  PT Education - 10/08/20 1930     Education Details see plan    Person(s) Educated Patient    Methods Explanation    Comprehension Verbalized understanding;Need further instruction                 PT Long Term Goals - 09/18/20 1633       PT LONG TERM GOAL #1   Title resolution of burning in Rt LE    Status Achieved      PT LONG TERM GOAL #2   Title pt will be able to return to running    Baseline has been a runner until his back pain began, began reintroducing running today    Status On-going      PT LONG TERM GOAL #3   Title demo gross 5/5 hip strength for support to lumbopelvic region    Baseline see flowsheet    Status On-going      PT LONG TERM GOAL #4   Title lifting floor to waist height  Status Achieved      PT LONG TERM GOAL #5   Title pusing/pulling for lawn mowing    Status Achieved      PT LONG TERM GOAL #6   Title pt will improve balance in order to safely ride a mobile road bike    Baseline began with bike stationary, has some hamstring tightness but working on posture.    Status On-going                   Plan - 10/08/20 1930     Clinical Impression Statement Continued to have pain today in post Lt leg- tightness in glut med and piriformis released with DN which pt reported did decrease some concordant pain. IASTM to reduce HS muscle belly tightness but concordant pain found upon palpation to proximal tendons rather than ischial tuberosity or muscle belly. Was able to sit for a few minutes comfortably after extensive manual treatment but could feel pain build eventually. tightness along Lt paraspinals with good joint mobility. I do think he would benefit from a steroid to decrease inflammation associated with tendonitis- ionto patch would not be successful in this area. will see him again tomorrow to re-evaluate and further decrease excessive muscle tension. I would like to get him back to a  reduced pain state and stay there for a while before progressing back into plyometrics which we discussed today.    PT Treatment/Interventions ADLs/Self Care Home Management;Cryotherapy;Aquatic Therapy;Electrical Stimulation;Gait training;Ultrasound;Traction;Moist Heat;Iontophoresis 4mg /ml Dexamethasone;Stair training;Functional mobility training;Therapeutic activities;Therapeutic exercise;Neuromuscular re-education;Manual techniques;Patient/family education;Passive range of motion;Dry needling;Taping;Joint Manipulations;Spinal Manipulations;Balance training    PT Next Visit Plan re-evaluate    PT Home Exercise Plan [id number]    Consulted and Agree with Plan of Care Patient             Patient will benefit from skilled therapeutic intervention in order to improve the following deficits and impairments:  Decreased range of motion, Difficulty walking, Increased muscle spasms, Decreased activity tolerance, Pain, Improper body mechanics, Impaired flexibility, Decreased strength, Postural dysfunction, Decreased balance  Visit Diagnosis: Difficulty in walking, not elsewhere classified  Other abnormalities of gait and mobility  Acute right-sided low back pain, unspecified whether sciatica present     Problem List Patient Active Problem List   Diagnosis Date Noted   Ischial bursitis of left side 10/08/2020   Spinal stenosis at L4-L5 level 05/29/2020   Lumbosacral radiculopathy 05/23/2020   Low back pain 04/24/2020   Hip pain 04/24/2020   Strain of calf muscle 04/24/2020   Prediabetes 04/24/2020   Hyperlipidemia 04/24/2020   Transaminitis 04/24/2020  Alioune Hodgkin C. Dijuan Sleeth PT, DPT 10/08/20 7:36 PM   [facility] [place], [place], [address] Phone: [phone]   Fax:  [phone]  Name: Sneijder Bernards MRN: [id number] Date of Birth: 07-Oct-1985

## 2020-10-09 ENCOUNTER — Encounter (HOSPITAL_BASED_OUTPATIENT_CLINIC_OR_DEPARTMENT_OTHER): Payer: Self-pay | Admitting: Physical Therapy

## 2020-10-09 ENCOUNTER — Ambulatory Visit (HOSPITAL_BASED_OUTPATIENT_CLINIC_OR_DEPARTMENT_OTHER): Payer: No Typology Code available for payment source | Admitting: Physical Therapy

## 2020-10-09 DIAGNOSIS — R262 Difficulty in walking, not elsewhere classified: Secondary | ICD-10-CM

## 2020-10-09 DIAGNOSIS — R2689 Other abnormalities of gait and mobility: Secondary | ICD-10-CM

## 2020-10-10 NOTE — Therapy (Signed)
[facility] [address] [place], [place], [address] Phone: [phone]   Fax:  [phone]  Physical Therapy Treatment  Patient Details  Name: Tracer Gutridge MRN: [id number] Date of Birth: 1985/12/27 Referring Provider (PT): de [place], Blondell Reveal, MD   Encounter Date: 10/09/2020   PT End of Session - 10/09/20 1605     Visit Number 20    Number of Visits 23    Date for PT Re-Evaluation 11/01/20    Authorization Type [facility]    PT Start Time 1600    PT Stop Time 1640    PT Time Calculation (min) 40 min    Activity Tolerance Patient tolerated treatment well    Behavior During Therapy [facility] for tasks assessed/performed             History reviewed. No pertinent past medical history.  Past Surgical History:  Procedure Laterality Date   KNEE ARTHROSCOPY  1999   SPINE SURGERY  12/29/2005    There were no vitals filed for this visit.   Subjective Assessment - 10/09/20 1605     Subjective I get numbness in the Lt buttock after I wake up that lasts about 15 min. It is better than it was yesterday but am feeling the typical afternoon incr in pain.    Patient Stated Goals return to PLOF, core strength, balance- ride mobile bike                               [facility] Adult PT Treatment/Exercise - 10/10/20 0001       Knee/Hip Exercises: Stretches   Passive Hamstring Stretch Limitations supine AHSS    Piriformis Stretch Limitations seated figure 4      Knee/Hip Exercises: Standing   Other Standing Knee Exercises walking following DN      Manual Therapy   Manual therapy comments skilled palpation and monitoring during TPDN    Soft tissue mobilization Lt HS semitend. & memb.              Trigger Point Dry Needling - 10/10/20 0001     Hamstring Response Twitch response elicited;Palpable increased muscle length   Lt proximal HS- perpendicular to all bellies and direct into semitendinosus                   PT Education - 10/09/20 1646     Education Details returning to gentle stretches, ice, outcome of DN to HS considering muscle v tendon relief.    Person(s) Educated Patient    Methods Explanation    Comprehension Verbalized understanding;Need further instruction                 PT Long Term Goals - 09/18/20 1633       PT LONG TERM GOAL #1   Title resolution of burning in Rt LE    Status Achieved      PT LONG TERM GOAL #2   Title pt will be able to return to running    Baseline has been a runner until his back pain began, began reintroducing running today    Status On-going      PT LONG TERM GOAL #3   Title demo gross 5/5 hip strength for support to lumbopelvic region    Baseline see flowsheet    Status On-going      PT LONG TERM GOAL #4   Title lifting floor to waist height  Status Achieved      PT LONG TERM GOAL #5   Title pusing/pulling for lawn mowing    Status Achieved      PT LONG TERM GOAL #6   Title pt will improve balance in order to safely ride a mobile road bike    Baseline began with bike stationary, has some hamstring tightness but working on posture.    Status On-going                   Plan - 10/10/20 3202     Clinical Impression Statement DN to hamstrings decreased excessive tension in semitend. and we discussed that it will take time to decrease irritation at proximal tendon. Reports MD denied worry for neuro involvement at this time and is waiting for Rx to help with inflammation.    PT Treatment/Interventions ADLs/Self Care Home Management;Cryotherapy;Aquatic Therapy;Electrical Stimulation;Gait training;Ultrasound;Traction;Moist Heat;Iontophoresis 4mg /ml Dexamethasone;Stair training;Functional mobility training;Therapeutic activities;Therapeutic exercise;Neuromuscular re-education;Manual techniques;Patient/family education;Passive range of motion;Dry needling;Taping;Joint Manipulations;Spinal Manipulations;Balance  training    PT Next Visit Plan DN PRN, recheck HS    PT Home Exercise Plan [id number]    Consulted and Agree with Plan of Care Patient             Patient will benefit from skilled therapeutic intervention in order to improve the following deficits and impairments:  Decreased range of motion, Difficulty walking, Increased muscle spasms, Decreased activity tolerance, Pain, Improper body mechanics, Impaired flexibility, Decreased strength, Postural dysfunction, Decreased balance  Visit Diagnosis: Difficulty in walking, not elsewhere classified  Other abnormalities of gait and mobility     Problem List Patient Active Problem List   Diagnosis Date Noted   Ischial bursitis of left side 10/08/2020   Spinal stenosis at L4-L5 level 05/29/2020   Lumbosacral radiculopathy 05/23/2020   Low back pain 04/24/2020   Hip pain 04/24/2020   Strain of calf muscle 04/24/2020   Prediabetes 04/24/2020   Hyperlipidemia 04/24/2020   Transaminitis 04/24/2020   Keshon Markovitz C. Yasuo Phimmasone PT, DPT 10/10/20 8:19 AM   [facility] [place], [place], [address] Phone: [phone]   Fax:  [phone]  Name: Jaydin Boniface MRN: [id number] Date of Birth: [birth year]

## 2020-10-16 ENCOUNTER — Other Ambulatory Visit: Payer: Self-pay

## 2020-10-16 ENCOUNTER — Encounter (HOSPITAL_BASED_OUTPATIENT_CLINIC_OR_DEPARTMENT_OTHER): Payer: Self-pay | Admitting: Physical Therapy

## 2020-10-16 ENCOUNTER — Ambulatory Visit (HOSPITAL_BASED_OUTPATIENT_CLINIC_OR_DEPARTMENT_OTHER): Payer: No Typology Code available for payment source | Attending: Family Medicine | Admitting: Physical Therapy

## 2020-10-16 DIAGNOSIS — R262 Difficulty in walking, not elsewhere classified: Secondary | ICD-10-CM | POA: Insufficient documentation

## 2020-10-16 DIAGNOSIS — M545 Low back pain, unspecified: Secondary | ICD-10-CM | POA: Insufficient documentation

## 2020-10-16 DIAGNOSIS — R2689 Other abnormalities of gait and mobility: Secondary | ICD-10-CM | POA: Insufficient documentation

## 2020-10-17 ENCOUNTER — Encounter (HOSPITAL_BASED_OUTPATIENT_CLINIC_OR_DEPARTMENT_OTHER): Payer: Self-pay | Admitting: Physical Therapy

## 2020-10-17 NOTE — Therapy (Signed)
[facility] [address] [place], [place], [address] Phone: [phone]   Fax:  [phone]  Physical Therapy Treatment  Patient Details  Name: Justin Morrow MRN: [id number] Date of Birth: August 01, 1985 Referring Provider (PT): de [place], Blondell Reveal, MD   Encounter Date: 10/16/2020   PT End of Session - 10/16/20 1601     Visit Number 21    Number of Visits 23    Date for PT Re-Evaluation 11/01/20    Authorization Type [facility]    PT Start Time 1600    PT Stop Time 1645    PT Time Calculation (min) 45 min    Activity Tolerance Patient tolerated treatment well    Behavior During Therapy [facility] for tasks assessed/performed             History reviewed. No pertinent past medical history.  Past Surgical History:  Procedure Laterality Date   KNEE ARTHROSCOPY  1999   SPINE SURGERY  12/29/2005    There were no vitals filed for this visit.   Subjective Assessment - 10/16/20 1612     Subjective Bettter compared to last time- reducing trigger point in hamstrings was beneficial. Still getting numbness in gluts especially after walking but relieved by laying prone or supine. I notice my hamstring stretching has gotten better.    Patient Stated Goals return to PLOF, core strength, balance- ride mobile bike    Currently in Pain? Yes    Pain Location Buttocks    Pain Orientation Left    Pain Descriptors / Indicators Aching    Aggravating Factors  sitting, standing for a few min    Pain Relieving Factors lay prone or supine                [facility] PT Assessment - 10/17/20 0001       Flexibility   Hamstrings bil HS to, visually, 45 deg without neural involvement                           [facility] Adult PT Treatment/Exercise - 10/17/20 0001       Knee/Hip Exercises: Stretches   Passive Hamstring Stretch Limitations supine HSS- by PT    Piriformis Stretch Limitations supine figure 4      Modalities    Modalities Traction      Traction   Type of Traction Lumbar    Min (lbs) 20    Max (lbs) 70    Hold Time 60s    Rest Time 20s    Time 15 min                     PT Education - 10/17/20 1335     Education Details rationale for traction, anatomy of condition, returning to bike riding/exercises and monitoring/managing symptoms    Person(s) Educated Patient    Methods Explanation    Comprehension Verbalized understanding;Need further instruction                 PT Long Term Goals - 09/18/20 1633       PT LONG TERM GOAL #1   Title resolution of burning in Rt LE    Status Achieved      PT LONG TERM GOAL #2   Title pt will be able to return to running    Baseline has been a runner until his back pain began, began reintroducing running today    Status On-going  PT LONG TERM GOAL #3   Title demo gross 5/5 hip strength for support to lumbopelvic region    Baseline see flowsheet    Status On-going      PT LONG TERM GOAL #4   Title lifting floor to waist height    Status Achieved      PT LONG TERM GOAL #5   Title pusing/pulling for lawn mowing    Status Achieved      PT LONG TERM GOAL #6   Title pt will improve balance in order to safely ride a mobile road bike    Baseline began with bike stationary, has some hamstring tightness but working on posture.    Status On-going                   Plan - 10/17/20 1337     Clinical Impression Statement Pt completed stretches prior to standing from finishing traction and then was asked to sit for about 3 min and then stand for about 5 min while educating- at this point he did not experience a return of numbness in gluts, he also did not mention return prior to leaving. Will continue based on symptoms. When discussing use of bike- stationary seemed to be ok but did have return of HS tension following, concordant symptoms returned with mobile bike. Likely that anatomy of spine is not ready to support  motion requirements when riding a rode bike.    PT Treatment/Interventions ADLs/Self Care Home Management;Cryotherapy;Aquatic Therapy;Electrical Stimulation;Gait training;Ultrasound;Traction;Moist Heat;Iontophoresis 4mg /ml Dexamethasone;Stair training;Functional mobility training;Therapeutic activities;Therapeutic exercise;Neuromuscular re-education;Manual techniques;Patient/family education;Passive range of motion;Dry needling;Taping;Joint Manipulations;Spinal Manipulations;Balance training    PT Next Visit Plan re-eval soon, outcome of traction?    PT Home Exercise Plan [id number]    Consulted and Agree with Plan of Care Patient             Patient will benefit from skilled therapeutic intervention in order to improve the following deficits and impairments:  Decreased range of motion, Difficulty walking, Increased muscle spasms, Decreased activity tolerance, Pain, Improper body mechanics, Impaired flexibility, Decreased strength, Postural dysfunction, Decreased balance  Visit Diagnosis: Difficulty in walking, not elsewhere classified  Other abnormalities of gait and mobility  Acute right-sided low back pain, unspecified whether sciatica present     Problem List Patient Active Problem List   Diagnosis Date Noted   Ischial bursitis of left side 10/08/2020   Spinal stenosis at L4-L5 level 05/29/2020   Lumbosacral radiculopathy 05/23/2020   Low back pain 04/24/2020   Hip pain 04/24/2020   Strain of calf muscle 04/24/2020   Prediabetes 04/24/2020   Hyperlipidemia 04/24/2020   Transaminitis 04/24/2020   Garrison Michie C. Leelynd Maldonado PT, DPT 10/17/20 1:44 PM  [facility] [address] [place], [place], [address] Phone: [phone]   Fax:  [phone]  Name: Justin Morrow MRN: [id number] Date of Birth: [birth year]

## 2020-10-21 ENCOUNTER — Encounter (HOSPITAL_BASED_OUTPATIENT_CLINIC_OR_DEPARTMENT_OTHER): Payer: Self-pay | Admitting: Physical Therapy

## 2020-10-23 ENCOUNTER — Other Ambulatory Visit: Payer: Self-pay

## 2020-10-23 ENCOUNTER — Ambulatory Visit (HOSPITAL_BASED_OUTPATIENT_CLINIC_OR_DEPARTMENT_OTHER): Payer: No Typology Code available for payment source | Admitting: Physical Therapy

## 2020-10-23 ENCOUNTER — Encounter (HOSPITAL_BASED_OUTPATIENT_CLINIC_OR_DEPARTMENT_OTHER): Payer: Self-pay | Admitting: Physical Therapy

## 2020-10-23 DIAGNOSIS — R2689 Other abnormalities of gait and mobility: Secondary | ICD-10-CM

## 2020-10-23 DIAGNOSIS — R262 Difficulty in walking, not elsewhere classified: Secondary | ICD-10-CM

## 2020-10-24 NOTE — Therapy (Signed)
[facility] [address] [place], [place], [address] Phone: [phone]   Fax:  [phone]  Physical Therapy Treatment  Patient Details  Name: Justin Morrow MRN: [id number] Date of Birth: 01-22-1985 Referring Provider (PT): de [place], Blondell Reveal, MD   Encounter Date: 10/23/2020   PT End of Session - 10/23/20 1605     Visit Number 22    Number of Visits 23    Date for PT Re-Evaluation 11/01/20    Authorization Type [facility]    PT Start Time 1600    PT Stop Time 1645    PT Time Calculation (min) 45 min    Activity Tolerance Patient tolerated treatment well    Behavior During Therapy [facility] for tasks assessed/performed             History reviewed. No pertinent past medical history.  Past Surgical History:  Procedure Laterality Date   KNEE ARTHROSCOPY  1999   SPINE SURGERY  12/29/2005    There were no vitals filed for this visit.   Subjective Assessment - 10/24/20 1244     Subjective numbness returned and piriformis increased in pain with stretching- used ESTIM unit which is helpful but still needs to stand. the right side is fine- it all moved over to the Lt.    Patient Stated Goals return to PLOF, core strength, balance- ride mobile bike    Currently in Pain? Yes    Pain Location Buttocks    Pain Orientation Left    Pain Descriptors / Indicators Tightness    Aggravating Factors  sitting    Pain Relieving Factors lay prone                               [facility] Adult PT Treatment/Exercise - 10/24/20 0001       Knee/Hip Exercises: Stretches   Piriformis Stretch Limitations supine on towel roll stretch              Trigger Point Dry Needling - 10/24/20 0001     Gluteus Minimus Response Twitch response elicited;Palpable increased muscle length   Lt   Gluteus Medius Response Twitch response elicited;Palpable increased muscle length   Lt   Piriformis Response Twitch response  elicited;Palpable increased muscle length   Lt                  PT Education - 10/24/20 1243     Education Details anatomy of condition, POC, extension program, DN    Person(s) Educated Patient    Methods Explanation    Comprehension Verbalized understanding;Need further instruction                 PT Long Term Goals - 09/18/20 1633       PT LONG TERM GOAL #1   Title resolution of burning in Rt LE    Status Achieved      PT LONG TERM GOAL #2   Title pt will be able to return to running    Baseline has been a runner until his back pain began, began reintroducing running today    Status On-going      PT LONG TERM GOAL #3   Title demo gross 5/5 hip strength for support to lumbopelvic region    Baseline see flowsheet    Status On-going      PT LONG TERM GOAL #4   Title lifting floor to waist height  Status Achieved      PT LONG TERM GOAL #5   Title pusing/pulling for lawn mowing    Status Achieved      PT LONG TERM GOAL #6   Title pt will improve balance in order to safely ride a mobile road bike    Baseline began with bike stationary, has some hamstring tightness but working on posture.    Status On-going                   Plan - 10/24/20 1246     Clinical Impression Statement DN was effective in reducing muscular tension in Lt buttock and knows to expect some soreness. I asked him to continue using ESTIM unit throughout his day for pain control and will return to use of extension bias program for pain centralization. While he has experienced improvement in his pain from the start of therapy, he has experienced a large set back with symptoms that have been difficult to control. I recommended that he contact Dr Vertell Limber again to recap treatments because these symptoms continue to limit QOL. Educated on option for standing extension to trial as needed if he cannot get into prone.    PT Treatment/Interventions ADLs/Self Care Home  Management;Cryotherapy;Aquatic Therapy;Electrical Stimulation;Gait training;Ultrasound;Traction;Moist Heat;Iontophoresis 4mg /ml Dexamethasone;Stair training;Functional mobility training;Therapeutic activities;Therapeutic exercise;Neuromuscular re-education;Manual techniques;Patient/family education;Passive range of motion;Dry needling;Taping;Joint Manipulations;Spinal Manipulations;Balance training    PT Next Visit Plan continue based on symptoms, ERO    PT Home Exercise Plan [id number]    Consulted and Agree with Plan of Care Patient             Patient will benefit from skilled therapeutic intervention in order to improve the following deficits and impairments:  Decreased range of motion, Difficulty walking, Increased muscle spasms, Decreased activity tolerance, Pain, Improper body mechanics, Impaired flexibility, Decreased strength, Postural dysfunction, Decreased balance  Visit Diagnosis: Difficulty in walking, not elsewhere classified  Other abnormalities of gait and mobility     Problem List Patient Active Problem List   Diagnosis Date Noted   Ischial bursitis of left side 10/08/2020   Spinal stenosis at L4-L5 level 05/29/2020   Lumbosacral radiculopathy 05/23/2020   Low back pain 04/24/2020   Hip pain 04/24/2020   Strain of calf muscle 04/24/2020   Prediabetes 04/24/2020   Hyperlipidemia 04/24/2020   Transaminitis 04/24/2020   Justin Morrow C. Justin Morrow PT, DPT 10/24/20 1:08 PM  [facility] [address] [place], [place], [address] Phone: [phone]   Fax:  [phone]  Name: Justin Morrow MRN: [id number] Date of Birth: [birth year]

## 2020-10-25 ENCOUNTER — Other Ambulatory Visit: Payer: Self-pay

## 2020-10-25 ENCOUNTER — Ambulatory Visit (INDEPENDENT_AMBULATORY_CARE_PROVIDER_SITE_OTHER): Payer: No Typology Code available for payment source | Admitting: Family Medicine

## 2020-10-25 ENCOUNTER — Encounter (HOSPITAL_BASED_OUTPATIENT_CLINIC_OR_DEPARTMENT_OTHER): Payer: Self-pay | Admitting: Family Medicine

## 2020-10-25 VITALS — BP 120/78 | HR 65 | Ht 66.0 in | Wt 143.0 lb

## 2020-10-25 DIAGNOSIS — R310 Gross hematuria: Secondary | ICD-10-CM

## 2020-10-25 DIAGNOSIS — R319 Hematuria, unspecified: Secondary | ICD-10-CM

## 2020-10-25 LAB — POCT URINALYSIS DIPSTICK
Bilirubin, UA: NEGATIVE
Glucose, UA: NEGATIVE
Ketones, UA: NEGATIVE
Leukocytes, UA: NEGATIVE
Nitrite, UA: NEGATIVE
Protein, UA: NEGATIVE
Spec Grav, UA: <=1.005 — AB (ref 1.010–1.025)
Urobilinogen, UA: 0.2 E.U./dL
pH, UA: 6 (ref 5.0–8.0)

## 2020-10-25 LAB — POCT UA - MICROALBUMIN
Creatinine, POC: 50 mg/dL
Microalbumin Ur, POC: 10 mg/L

## 2020-10-25 NOTE — Progress Notes (Signed)
    Procedures performed today:    None.  Independent interpretation of notes and tests performed by another provider:   None.  Brief History, Exam, Impression, and Recommendations:    BP 120/78   Pulse 65   Ht 5\' 6"  (1.676 m)   Wt 143 lb (64.9 kg)   SpO2 98%   BMI 23.08 kg/m   Gross hematuria First noticed pinkish tint to urine about 2 days ago.  Patient does have a history of kidney stones, most recent was in 2020.  When he noticed the pinkish tint, he began to aggressively hydrate.  Urine gradually returned to normal later that day and yesterday.  However, this morning he noticed a return of the pinkish tint to his urine.  Has not had any associated flank pain, abdominal pain, dysuria, fevers, chills, night sweats.  Never had any stone evaluation completed in the past, has not been evaluated with urology in the past. Blood pressure is normal in the office, no lower extremity edema. In the office today, patient is in no acute distress. Point-of-care urinalysis completed which was largely unremarkable except for presence of "large" blood on urinalysis Discussed recommended evaluation patient We will proceed with microscopic urinalysis, assessment for microalbuminuria, CMP to evaluate for elevated serum creatinine, hypoalbuminemia Referral to urology placed, evaluation likely to include cystoscopy, possible abdominopelvic CT scan Consider referral to nephrology for any evidence of kidney dysfunction, concern for glomerular etiology of symptoms Plan for follow-up as previously scheduled   ___________________________________________ Rannie Craney de [place], MD, ABFM, CAQSM Primary Care and [place]

## 2020-10-25 NOTE — Assessment & Plan Note (Signed)
First noticed pinkish tint to urine about 2 days ago.  Patient does have a history of kidney stones, most recent was in 2020.  When he noticed the pinkish tint, he began to aggressively hydrate.  Urine gradually returned to normal later that day and yesterday.  However, this morning he noticed a return of the pinkish tint to his urine.  Has not had any associated flank pain, abdominal pain, dysuria, fevers, chills, night sweats.  Never had any stone evaluation completed in the past, has not been evaluated with urology in the past. Blood pressure is normal in the office, no lower extremity edema. In the office today, patient is in no acute distress. Point-of-care urinalysis completed which was largely unremarkable except for presence of "large" blood on urinalysis Discussed recommended evaluation patient We will proceed with microscopic urinalysis, assessment for microalbuminuria, CMP to evaluate for elevated serum creatinine, hypoalbuminemia Referral to urology placed, evaluation likely to include cystoscopy, possible abdominopelvic CT scan Consider referral to nephrology for any evidence of kidney dysfunction, concern for glomerular etiology of symptoms Plan for follow-up as previously scheduled

## 2020-10-25 NOTE — Patient Instructions (Signed)
  Medication Instructions:  Your physician recommends that you continue on your current medications as directed. Please refer to the Current Medication list given to you today. --If you need a refill on any your medications before your next appointment, please call your pharmacy first. If no refills are authorized on file call the office.-- Lab Work: Your physician has recommended that you have lab work today: CMP, UA Microalbmin, Microscopic Urinalysis  If you have labs (blood work) drawn today and your tests are completely normal, you will receive your results via [facility] a phone call from our staff.  Please ensure you check your voicemail in the event that you authorized detailed messages to be left on a delegated number. If you have any lab test that is abnormal or we need to change your treatment, we will call you to review the results.  Referrals/Procedures/Imaging: A referral has been placed for you to [facility] for evaluation and treatment. Someone from the scheduling department will be in contact with you in regards to coordinating your consultation. If you do not hear from any of the schedulers within 7-10 business days please give their office a call.  [facility] [phone] [place]  Follow-Up: Your next appointment:   Your physician recommends that you schedule a follow-up appointment in: AS SCHEDULED with Dr. de [place]  You will receive a text message or e-mail with a link to a survey about your care and experience with [place] today! We would greatly appreciate your feedback!   Thanks for letting us be apart of your health journey!!  Primary Care and Sports Medicine   Dr. Arlina Robes [place]   We encourage you to activate your patient portal called "MyChart".  Sign up information is provided on this After Visit Summary.  MyChart is used to connect with patients for Virtual Visits (Telemedicine).  Patients are able to view lab/test  results, encounter notes, upcoming appointments, etc.  Non-urgent messages can be sent to your provider as well. To learn more about what you can do with MyChart, please visit --  [email or web address].

## 2020-10-26 LAB — URINALYSIS, MICROSCOPIC ONLY
Bacteria, UA: NONE SEEN
Casts: NONE SEEN /lpf
Epithelial Cells (non renal): NONE SEEN /hpf (ref 0–10)
RBC, Urine: NONE SEEN /hpf (ref 0–2)
WBC, UA: NONE SEEN /hpf (ref 0–5)

## 2020-10-26 LAB — COMPREHENSIVE METABOLIC PANEL
ALT: 34 IU/L (ref 0–44)
AST: 31 IU/L (ref 0–40)
Albumin/Globulin Ratio: 1.9 (ref 1.2–2.2)
Albumin: 5 g/dL (ref 4.0–5.0)
Alkaline Phosphatase: 64 IU/L (ref 44–121)
BUN/Creatinine Ratio: 13 (ref 9–20)
BUN: 10 mg/dL (ref 6–20)
Bilirubin Total: 0.8 mg/dL (ref 0.0–1.2)
CO2: 18 mmol/L — ABNORMAL LOW (ref 20–29)
Calcium: 9.9 mg/dL (ref 8.7–10.2)
Chloride: 100 mmol/L (ref 96–106)
Creatinine, Ser: 0.76 mg/dL (ref 0.76–1.27)
Globulin, Total: 2.6 g/dL (ref 1.5–4.5)
Glucose: 91 mg/dL (ref 70–99)
Potassium: 3.8 mmol/L (ref 3.5–5.2)
Sodium: 137 mmol/L (ref 134–144)
Total Protein: 7.6 g/dL (ref 6.0–8.5)
eGFR: 119 mL/min/{1.73_m2} (ref >59)

## 2020-10-28 ENCOUNTER — Encounter (HOSPITAL_BASED_OUTPATIENT_CLINIC_OR_DEPARTMENT_OTHER): Payer: Self-pay | Admitting: Family Medicine

## 2020-10-28 ENCOUNTER — Telehealth (HOSPITAL_BASED_OUTPATIENT_CLINIC_OR_DEPARTMENT_OTHER): Payer: Self-pay

## 2020-10-28 DIAGNOSIS — R310 Gross hematuria: Secondary | ICD-10-CM

## 2020-10-28 DIAGNOSIS — R319 Hematuria, unspecified: Secondary | ICD-10-CM

## 2020-10-28 NOTE — Telephone Encounter (Signed)
-----   Message from Raymond J de [place], MD sent at 10/28/2020 12:56 PM EDT ----- Kidney function is normal with normal electrolytes and liver function.  Serum albumin is also within normal limits.  Microscopic urinalysis with no white blood cells observed, also no casts which is reassuring.  Urine microalbumin/creatinine ratio was slightly above the normal range.  Due to this, would recommend referral to nephrology.  Would continue with referral to urology as well.  For referral to nephrology, utilize diagnoses of hematuria and moderately increased albuminuria.

## 2020-10-28 NOTE — Telephone Encounter (Signed)
Results released by Dr. de [place] and reviewed by patient via [place] patient to contact the office with any questions or concerns. Referrals placed

## 2020-10-30 ENCOUNTER — Other Ambulatory Visit: Payer: Self-pay

## 2020-10-30 ENCOUNTER — Encounter (HOSPITAL_BASED_OUTPATIENT_CLINIC_OR_DEPARTMENT_OTHER): Payer: Self-pay | Admitting: Physical Therapy

## 2020-10-30 ENCOUNTER — Ambulatory Visit (HOSPITAL_BASED_OUTPATIENT_CLINIC_OR_DEPARTMENT_OTHER): Payer: No Typology Code available for payment source | Admitting: Physical Therapy

## 2020-10-30 DIAGNOSIS — M545 Low back pain, unspecified: Secondary | ICD-10-CM

## 2020-10-30 DIAGNOSIS — R2689 Other abnormalities of gait and mobility: Secondary | ICD-10-CM

## 2020-10-30 DIAGNOSIS — R262 Difficulty in walking, not elsewhere classified: Secondary | ICD-10-CM

## 2020-10-30 NOTE — Therapy (Signed)
[facility] [address] [place], [place], [address] Phone: [phone]   Fax:  [phone]  Physical Therapy Treatment/Re-certification  Patient Details  Name: Justin Morrow MRN: [id number] Date of Birth: [birth year] Referring Provider (PT): de [place], Blondell Reveal, MD   Encounter Date: 10/30/2020   PT End of Session - 10/30/20 1603     Visit Number 23    Number of Visits 29    Date for PT Re-Evaluation 12/13/20    Authorization Type [facility]    PT Start Time 1600    PT Stop Time 1645    PT Time Calculation (min) 45 min    Activity Tolerance Patient tolerated treatment well    Behavior During Therapy [facility] for tasks assessed/performed             History reviewed. No pertinent past medical history.  Past Surgical History:  Procedure Laterality Date   KNEE ARTHROSCOPY  1999   SPINE SURGERY  12/29/2005    There were no vitals filed for this visit.   Subjective Assessment - 10/30/20 2028     Subjective I was able to sit for a few patients yesterday. Pain centralized to piriformis region. Hamstrings are sore. tried glut exercises but I don't think I am quite ready. I am making progress.    Patient Stated Goals return to PLOF, core strength, balance- ride mobile bike    Currently in Pain? Yes    Pain Location Buttocks    Pain Orientation Left    Pain Descriptors / Indicators Sore;Spasm    Pain Radiating Towards hamstrings    Aggravating Factors  sitting    Pain Relieving Factors TENS, lay down                [facility] PT Assessment - 10/30/20 0001       Assessment   Medical Diagnosis LBP, Rt hip pain, Rt calf pain    Referring Provider (PT) [place], Blondell Reveal, MD    Prior Therapy not for this episode      Precautions   Precautions None      Restrictions   Weight Bearing Restrictions No      Balance Screen   Has the patient fallen in the past 6 months No      [place] residence      Flexibility   Hamstrings HS Rt 55 deg, Lt 35 deg                           [facility] Adult PT Treatment/Exercise - 10/30/20 0001       Knee/Hip Exercises: Stretches   Piriformis Stretch Limitations supine figure 4      Knee/Hip Exercises: Sidelying   Clams x20    Other Sidelying Knee/Hip Exercises SL hip abd Lt      Manual Therapy   Manual therapy comments skilled palpation and monitoring during TPDN    Joint Mobilization Lt upper quadrant sacral PA, prone Lt innom inf press,    Soft tissue mobilization Lt hip region following DN              Trigger Point Dry Needling - 10/30/20 0001     Other Dry Needling TFL, all on Lt today    Gluteus Medius Response Twitch response elicited;Palpable increased muscle length    Piriformis Response Twitch response elicited;Palpable increased muscle length  PT Education - 10/30/20 2030     Education Details goals & progress, anatomy of condition    Person(s) Educated Patient    Methods Explanation;Demonstration;Tactile cues;Verbal cues    Comprehension Verbalized understanding;Returned demonstration;Verbal cues required;Tactile cues required;Need further instruction                 PT Long Term Goals - 10/30/20 1604       PT LONG TERM GOAL #1   Title resolution of burning in Rt LE    Status New      PT LONG TERM GOAL #2   Title pt will be able to return to running    Baseline did return but pain returned and now working to resolve      PT LONG TERM GOAL #3   Title demo gross 5/5 hip strength for support to lumbopelvic region    Baseline see flowsheet    Status On-going      PT LONG TERM GOAL #4   Title lifting floor to waist height    Status Achieved      PT LONG TERM GOAL #5   Title pusing/pulling for lawn mowing    Status Achieved      Additional Long Term Goals   Additional Long Term Goals Yes      PT LONG TERM GOAL #6   Title pt will  improve balance in order to safely ride a mobile road bike    Baseline lacking balance on Rt vs Lt    Status On-going      PT LONG TERM GOAL #7   Title pt will tolerate a seated position for timeframe required to complete patient visits    Baseline unable to tolerate sitting, was able to sit for 3 patients yesterday but was very sore after    Time 6    Period Weeks    Status New    Target Date 12/13/20                   Plan - 10/30/20 2033     Clinical Impression Statement Overall pt is doing pretty well today and pain is centralizing to Lt piriformis region. Contacted Dr Donald Pore office and spoke with on-call physician who did not find his situation alarming. Manual therapy today to improve mobility at SIJs. Limited flexibility in bil HS with Lt worse than Rt- unequal pull creating unbalanced support to lumbar spine. concordant symptoms noted distally with DN to piriformis and glut med today- HEP to activate hip ERs and abductors in appropriate length-tension relationship. will extend POC to continue progressing and moving toward reaching functional long term goals.    PT Frequency 1x / week    PT Duration 6 weeks    PT Treatment/Interventions ADLs/Self Care Home Management;Cryotherapy;Aquatic Therapy;Electrical Stimulation;Gait training;Ultrasound;Traction;Moist Heat;Iontophoresis 4mg /ml Dexamethasone;Stair training;Functional mobility training;Therapeutic activities;Therapeutic exercise;Neuromuscular re-education;Manual techniques;Patient/family education;Passive range of motion;Dry needling;Taping;Joint Manipulations;Spinal Manipulations;Balance training    PT Next Visit Plan outcome of DN? extension bias program, core stability    PT Home Exercise Plan [id number]    Consulted and Agree with Plan of Care Patient             Patient will benefit from skilled therapeutic intervention in order to improve the following deficits and impairments:  Decreased range of motion,  Difficulty walking, Increased muscle spasms, Decreased activity tolerance, Pain, Improper body mechanics, Impaired flexibility, Decreased strength, Postural dysfunction, Decreased balance  Visit Diagnosis: Difficulty in walking, not elsewhere classified - Plan: PT plan  of care cert/re-cert  Other abnormalities of gait and mobility - Plan: PT plan of care cert/re-cert  Acute right-sided low back pain, unspecified whether sciatica present - Plan: PT plan of care cert/re-cert     Problem List Patient Active Problem List   Diagnosis Date Noted   Gross hematuria 10/25/2020   Ischial bursitis of left side 10/08/2020   Spinal stenosis at L4-L5 level 05/29/2020   Lumbosacral radiculopathy 05/23/2020   Low back pain 04/24/2020   Hip pain 04/24/2020   Strain of calf muscle 04/24/2020   Prediabetes 04/24/2020   Hyperlipidemia 04/24/2020   Transaminitis 04/24/2020   Donnavan Covault C. Arlan Birks PT, DPT 10/30/20 8:42 PM   [facility] [address] [place], [place], [address] Phone: [phone]   Fax:  [phone]  Name: Justin Morrow MRN: [id number] Date of Birth: [birth year]

## 2020-11-08 ENCOUNTER — Ambulatory Visit: Payer: No Typology Code available for payment source

## 2020-11-12 ENCOUNTER — Encounter (HOSPITAL_BASED_OUTPATIENT_CLINIC_OR_DEPARTMENT_OTHER): Payer: Self-pay | Admitting: Family Medicine

## 2020-11-12 NOTE — Telephone Encounter (Signed)
Sent referrals again via fax received confirmation and call offices.

## 2020-11-13 ENCOUNTER — Ambulatory Visit (HOSPITAL_BASED_OUTPATIENT_CLINIC_OR_DEPARTMENT_OTHER): Payer: No Typology Code available for payment source | Attending: Family Medicine | Admitting: Physical Therapy

## 2020-11-13 ENCOUNTER — Encounter (HOSPITAL_BASED_OUTPATIENT_CLINIC_OR_DEPARTMENT_OTHER): Payer: Self-pay | Admitting: Physical Therapy

## 2020-11-13 ENCOUNTER — Other Ambulatory Visit: Payer: Self-pay

## 2020-11-13 DIAGNOSIS — R262 Difficulty in walking, not elsewhere classified: Secondary | ICD-10-CM | POA: Diagnosis not present

## 2020-11-13 DIAGNOSIS — R2689 Other abnormalities of gait and mobility: Secondary | ICD-10-CM | POA: Insufficient documentation

## 2020-11-13 DIAGNOSIS — M25561 Pain in right knee: Secondary | ICD-10-CM | POA: Insufficient documentation

## 2020-11-13 DIAGNOSIS — M545 Low back pain, unspecified: Secondary | ICD-10-CM | POA: Insufficient documentation

## 2020-11-13 DIAGNOSIS — M25551 Pain in right hip: Secondary | ICD-10-CM | POA: Insufficient documentation

## 2020-11-13 DIAGNOSIS — G8929 Other chronic pain: Secondary | ICD-10-CM | POA: Insufficient documentation

## 2020-11-13 NOTE — Therapy (Signed)
[facility] [address] [place], [place], [address] Phone: [phone]   Fax:  [phone]  Physical Therapy Treatment  Patient Details  Name: Justin Morrow MRN: [id number] Date of Birth: [birth year] Referring Provider (PT): [place], Blondell Reveal, MD   Encounter Date: 11/13/2020   PT End of Session - 11/13/20 1605     Visit Number 24    Number of Visits 29    Date for PT Re-Evaluation 12/13/20    Authorization Type [facility]    PT Start Time 1604    PT Stop Time 1645    PT Time Calculation (min) 41 min    Activity Tolerance Patient tolerated treatment well    Behavior During Therapy [facility] for tasks assessed/performed             History reviewed. No pertinent past medical history.  Past Surgical History:  Procedure Laterality Date   KNEE ARTHROSCOPY  1999   SPINE SURGERY  12/29/2005    There were no vitals filed for this visit.   Subjective Assessment - 11/13/20 1606     Subjective Finally got the rock sensation out of piriformis. QLs were tight after trying to do a faster walk last Wed and had to take about 5 days off. I can sit through in person patients- stand for virtual and doing notes. Piriformis starts feeling weak after standing still for too long- roughly 75-80 min.    Patient Stated Goals return to PLOF, core strength, balance- ride mobile bike    Currently in Pain? No/denies                [facility] PT Assessment - 11/13/20 0001       AROM   Overall AROM Comments hip extension does not cross neutral with core engaged                           [facility] Adult PT Treatment/Exercise - 11/13/20 0001       Knee/Hip Exercises: Standing   Other Standing Knee Exercises pull off squats      Knee/Hip Exercises: Sidelying   Other Sidelying Knee/Hip Exercises abduction, circles, hip flexion      Knee/Hip Exercises: Prone   Other Prone Exercises over pillow core + knee exteded + knee  flexed                          PT Long Term Goals - 10/30/20 1604       PT LONG TERM GOAL #1   Title resolution of burning in Rt LE    Status New      PT LONG TERM GOAL #2   Title pt will be able to return to running    Baseline did return but pain returned and now working to resolve      PT LONG TERM GOAL #3   Title demo gross 5/5 hip strength for support to lumbopelvic region    Baseline see flowsheet    Status On-going      PT LONG TERM GOAL #4   Title lifting floor to waist height    Status Achieved      PT LONG TERM GOAL #5   Title pusing/pulling for lawn mowing    Status Achieved      Additional Long Term Goals   Additional Long Term Goals Yes      PT LONG TERM GOAL #6  Title pt will improve balance in order to safely ride a mobile road bike    Baseline lacking balance on Rt vs Lt    Status On-going      PT LONG TERM GOAL #7   Title pt will tolerate a seated position for timeframe required to complete patient visits    Baseline unable to tolerate sitting, was able to sit for 3 patients yesterday but was very sore after    Time 6    Period Weeks    Status New    Target Date 12/13/20                   Plan - 11/13/20 1941     Clinical Impression Statement piriformis released with combo of DN & massage which has improved tolerance to seated and standing. exercises focused on isolating gluts to support and pressure taken away from piriformis. squats altered in order to stretch piriformis with PT encouraging hips shift to Rt which was somewhat irritable. added glut exercises to HEP and encouraged him to choose to challenge either walking distance or speed rather than both at the same time.    PT Treatment/Interventions ADLs/Self Care Home Management;Cryotherapy;Aquatic Therapy;Electrical Stimulation;Gait training;Ultrasound;Traction;Moist Heat;Iontophoresis 4mg /ml Dexamethasone;Stair training;Functional mobility training;Therapeutic  activities;Therapeutic exercise;Neuromuscular re-education;Manual techniques;Patient/family education;Passive range of motion;Dry needling;Taping;Joint Manipulations;Spinal Manipulations;Balance training    PT Next Visit Plan cont glut/core strengthening    PT Home Exercise Plan [id number]    Consulted and Agree with Plan of Care Patient             Patient will benefit from skilled therapeutic intervention in order to improve the following deficits and impairments:  Decreased range of motion, Difficulty walking, Increased muscle spasms, Decreased activity tolerance, Pain, Improper body mechanics, Impaired flexibility, Decreased strength, Postural dysfunction, Decreased balance  Visit Diagnosis: Difficulty in walking, not elsewhere classified  Other abnormalities of gait and mobility  Acute right-sided low back pain, unspecified whether sciatica present     Problem List Patient Active Problem List   Diagnosis Date Noted   Gross hematuria 10/25/2020   Ischial bursitis of left side 10/08/2020   Spinal stenosis at L4-L5 level 05/29/2020   Lumbosacral radiculopathy 05/23/2020   Low back pain 04/24/2020   Hip pain 04/24/2020   Strain of calf muscle 04/24/2020   Prediabetes 04/24/2020   Hyperlipidemia 04/24/2020   Transaminitis 04/24/2020    Anesha Hackert C. Jovi Zavadil PT, DPT 11/13/20 7:45 PM   [facility] [address] [place], [place], [address] Phone: [phone]   Fax:  [phone]  Name: Justin Morrow MRN: [id number] Date of Birth: May 01, 1985

## 2020-11-18 ENCOUNTER — Other Ambulatory Visit (HOSPITAL_BASED_OUTPATIENT_CLINIC_OR_DEPARTMENT_OTHER): Payer: No Typology Code available for payment source

## 2020-11-20 ENCOUNTER — Encounter (HOSPITAL_BASED_OUTPATIENT_CLINIC_OR_DEPARTMENT_OTHER): Payer: Self-pay

## 2020-11-20 ENCOUNTER — Encounter (HOSPITAL_BASED_OUTPATIENT_CLINIC_OR_DEPARTMENT_OTHER): Payer: No Typology Code available for payment source | Admitting: Physical Therapy

## 2020-11-25 ENCOUNTER — Ambulatory Visit (HOSPITAL_BASED_OUTPATIENT_CLINIC_OR_DEPARTMENT_OTHER): Payer: No Typology Code available for payment source | Admitting: Family Medicine

## 2020-11-26 ENCOUNTER — Encounter (HOSPITAL_BASED_OUTPATIENT_CLINIC_OR_DEPARTMENT_OTHER): Payer: No Typology Code available for payment source | Admitting: Physical Therapy

## 2020-11-27 ENCOUNTER — Other Ambulatory Visit: Payer: Self-pay

## 2020-11-27 ENCOUNTER — Ambulatory Visit: Payer: No Typology Code available for payment source | Attending: Internal Medicine

## 2020-11-27 DIAGNOSIS — Z23 Encounter for immunization: Secondary | ICD-10-CM

## 2020-11-28 ENCOUNTER — Other Ambulatory Visit (HOSPITAL_BASED_OUTPATIENT_CLINIC_OR_DEPARTMENT_OTHER): Payer: Self-pay

## 2020-11-28 MED ORDER — MODERNA COVID-19 BIVAL BOOSTER 50 MCG/0.5ML IM SUSP
INTRAMUSCULAR | 0 refills | Status: DC
  Filled 2020-11-28: qty 0.5, 1d supply, fill #0

## 2020-11-28 NOTE — Progress Notes (Signed)
   Covid-19 Vaccination Clinic  Name:  Justin Morrow    MRN: [id number] DOB: Jun 24, 1985  11/28/2020  Mr. Justin Morrow was observed post Covid-19 immunization for 15 minutes without incident. He was provided with Vaccine Information Sheet and instruction to access the V-Safe system.   Mr. Justin Morrow was instructed to call 911 with any severe reactions post vaccine: Difficulty breathing  Swelling of face and throat  A fast heartbeat  A bad rash all over body  Dizziness and weakness   Immunizations Administered     Name Date Dose VIS Date Route   Moderna Covid-19 vaccine Bivalent Booster 11/27/2020  3:14 PM 0.5 mL 08/24/2020 Intramuscular   Manufacturer: Moderna   Lot: [id number]   [place]: [id number]

## 2020-12-03 ENCOUNTER — Encounter (HOSPITAL_BASED_OUTPATIENT_CLINIC_OR_DEPARTMENT_OTHER): Payer: Self-pay | Admitting: Physical Therapy

## 2020-12-03 ENCOUNTER — Other Ambulatory Visit: Payer: Self-pay

## 2020-12-03 ENCOUNTER — Ambulatory Visit (HOSPITAL_BASED_OUTPATIENT_CLINIC_OR_DEPARTMENT_OTHER): Payer: No Typology Code available for payment source | Admitting: Physical Therapy

## 2020-12-03 DIAGNOSIS — G8929 Other chronic pain: Secondary | ICD-10-CM

## 2020-12-03 DIAGNOSIS — R2689 Other abnormalities of gait and mobility: Secondary | ICD-10-CM

## 2020-12-03 DIAGNOSIS — R262 Difficulty in walking, not elsewhere classified: Secondary | ICD-10-CM

## 2020-12-03 DIAGNOSIS — M545 Low back pain, unspecified: Secondary | ICD-10-CM

## 2020-12-03 DIAGNOSIS — M25551 Pain in right hip: Secondary | ICD-10-CM

## 2020-12-04 ENCOUNTER — Encounter (HOSPITAL_BASED_OUTPATIENT_CLINIC_OR_DEPARTMENT_OTHER): Payer: Self-pay | Admitting: Physical Therapy

## 2020-12-04 NOTE — Therapy (Signed)
[facility] [address] [place], [place], [address] Phone: [phone]   Fax:  [phone]  Physical Therapy Treatment  Patient Details  Name: Justin Morrow MRN: [id number] Date of Birth: 1985/10/26 Referring Provider (PT): [place], Blondell Reveal, MD   Encounter Date: 12/03/2020   PT End of Session - 12/04/20 1344     Visit Number 25    Number of Visits 29    Date for PT Re-Evaluation 12/13/20    Authorization Type [facility]    PT Start Time 1600    PT Stop Time 1642    PT Time Calculation (min) 42 min    Activity Tolerance Patient tolerated treatment well    Behavior During Therapy [facility] for tasks assessed/performed             History reviewed. No pertinent past medical history.  Past Surgical History:  Procedure Laterality Date   KNEE ARTHROSCOPY  1999   SPINE SURGERY  12/29/2005    There were no vitals filed for this visit.   Subjective Assessment - 12/03/20 1625     Subjective Patient reports he has been doing pretty well. He has been able to do some of his exercises.    How long can you sit comfortably? it's fine, I know I need to get up and move more    How long can you walk comfortably? 40 min    Patient Stated Goals return to PLOF, core strength, balance- ride mobile bike    Currently in Pain? No/denies                               [facility] Adult PT Treatment/Exercise - 12/04/20 0001       Knee/Hip Exercises: Stretches   Piriformis Stretch Limitations supine figure 4      Knee/Hip Exercises: Standing   Other Standing Knee Exercises lateral band walks red x10; maonter walk 2x10      Knee/Hip Exercises: Supine   Other Supine Knee/Hip Exercises posterior pelvic tilts 2x10; bridge 2x10      Manual Therapy   Manual therapy comments skilled palpation and monitoring during TPDN    Soft tissue mobilization Lt hip region following DN; lumbar paraspinals              Trigger  Point Dry Needling - 12/04/20 0001     Consent Given? Yes    Other Dry Needling 2 spots in the left glut med and left piriformis bith wi    Gluteus Medius Response Twitch response elicited;Palpable increased muscle length    Piriformis Response Twitch response elicited;Palpable increased muscle length                   PT Education - 12/04/20 1343     Education Details HEP and symptom management    Person(s) Educated Patient    Methods Explanation;Demonstration;Tactile cues;Verbal cues    Comprehension Verbalized understanding;Returned demonstration;Verbal cues required;Tactile cues required                 PT Long Term Goals - 10/30/20 1604       PT LONG TERM GOAL #1   Title resolution of burning in Rt LE    Status New      PT LONG TERM GOAL #2   Title pt will be able to return to running    Baseline did return but pain returned and now working to resolve  PT LONG TERM GOAL #3   Title demo gross 5/5 hip strength for support to lumbopelvic region    Baseline see flowsheet    Status On-going      PT LONG TERM GOAL #4   Title lifting floor to waist height    Status Achieved      PT LONG TERM GOAL #5   Title pusing/pulling for lawn mowing    Status Achieved      Additional Long Term Goals   Additional Long Term Goals Yes      PT LONG TERM GOAL #6   Title pt will improve balance in order to safely ride a mobile road bike    Baseline lacking balance on Rt vs Lt    Status On-going      PT LONG TERM GOAL #7   Title pt will tolerate a seated position for timeframe required to complete patient visits    Baseline unable to tolerate sitting, was able to sit for 3 patients yesterday but was very sore after    Time 6    Period Weeks    Status New    Target Date 12/13/20                   Plan - 12/03/20 1638     Clinical Impression Statement Patient expressed intrest in returning to running. His plan is to work on his hip and core until  the spring, then return to running. Therapy added in retrun to running activity. He tolerated well. he had no increase in pain. Therapy alos perfromed manual therapy and trigger point dry needling to the patients pirifromis and gluteal.    Personal Factors and Comorbidities Comorbidity 2;Time since onset of injury/illness/exacerbation    Comorbidities runner with h/o injuries, h/o spinal surgery    Examination-Activity Limitations Bed Mobility;Bend;Sit;Sleep;Stand;Lift;Squat;Stairs;Caring for Others;Carry;Locomotion Level    Examination-Participation Restrictions Occupation;Other;Yard Work;Cleaning    Stability/Clinical Decision Making Unstable/Unpredictable    Clinical Decision Making Moderate    Rehab Potential Good    PT Frequency 1x / week    PT Duration 6 weeks    PT Treatment/Interventions ADLs/Self Care Home Management;Cryotherapy;Aquatic Therapy;Electrical Stimulation;Gait training;Ultrasound;Traction;Moist Heat;Iontophoresis 4mg /ml Dexamethasone;Stair training;Functional mobility training;Therapeutic activities;Therapeutic exercise;Neuromuscular re-education;Manual techniques;Patient/family education;Passive range of motion;Dry needling;Taping;Joint Manipulations;Spinal Manipulations;Balance training    PT Next Visit Plan cont glut/core strengthening    PT Home Exercise Plan [id number]    Consulted and Agree with Plan of Care Patient             Patient will benefit from skilled therapeutic intervention in order to improve the following deficits and impairments:  Decreased range of motion, Difficulty walking, Increased muscle spasms, Decreased activity tolerance, Pain, Improper body mechanics, Impaired flexibility, Decreased strength, Postural dysfunction, Decreased balance  Visit Diagnosis: Difficulty in walking, not elsewhere classified  Other abnormalities of gait and mobility  Acute right-sided low back pain, unspecified whether sciatica present  Pain in right hip  Chronic  pain of right knee     Problem List Patient Active Problem List   Diagnosis Date Noted   Gross hematuria 10/25/2020   Ischial bursitis of left side 10/08/2020   Spinal stenosis at L4-L5 level 05/29/2020   Lumbosacral radiculopathy 05/23/2020   Low back pain 04/24/2020   Hip pain 04/24/2020   Strain of calf muscle 04/24/2020   Prediabetes 04/24/2020   Hyperlipidemia 04/24/2020   Transaminitis 04/24/2020    Carney Living, PT 12/04/2020, 5:32 PM  [facility] [id number]  [place], [place], [address] Phone: [phone]   Fax:  [phone]  Name: May Ozment MRN: [id number] Date of Birth: 05-24-1985

## 2020-12-10 ENCOUNTER — Encounter (HOSPITAL_BASED_OUTPATIENT_CLINIC_OR_DEPARTMENT_OTHER): Payer: Self-pay | Admitting: Physical Therapy

## 2020-12-16 ENCOUNTER — Ambulatory Visit (HOSPITAL_BASED_OUTPATIENT_CLINIC_OR_DEPARTMENT_OTHER): Payer: No Typology Code available for payment source | Attending: Family Medicine | Admitting: Physical Therapy

## 2020-12-16 ENCOUNTER — Encounter (HOSPITAL_BASED_OUTPATIENT_CLINIC_OR_DEPARTMENT_OTHER): Payer: Self-pay | Admitting: Physical Therapy

## 2020-12-16 ENCOUNTER — Other Ambulatory Visit: Payer: Self-pay

## 2020-12-16 DIAGNOSIS — M545 Low back pain, unspecified: Secondary | ICD-10-CM | POA: Diagnosis present

## 2020-12-16 DIAGNOSIS — G8929 Other chronic pain: Secondary | ICD-10-CM | POA: Diagnosis present

## 2020-12-16 DIAGNOSIS — M25561 Pain in right knee: Secondary | ICD-10-CM | POA: Diagnosis present

## 2020-12-16 DIAGNOSIS — R2689 Other abnormalities of gait and mobility: Secondary | ICD-10-CM

## 2020-12-16 DIAGNOSIS — M25551 Pain in right hip: Secondary | ICD-10-CM

## 2020-12-16 DIAGNOSIS — R262 Difficulty in walking, not elsewhere classified: Secondary | ICD-10-CM

## 2020-12-17 ENCOUNTER — Encounter (HOSPITAL_BASED_OUTPATIENT_CLINIC_OR_DEPARTMENT_OTHER): Payer: Self-pay | Admitting: Physical Therapy

## 2020-12-17 NOTE — Therapy (Signed)
[facility] [address] [place], [place], [address] Phone: [phone]   Fax:  [phone]  Physical Therapy Treatment  Patient Details  Name: Justin Morrow MRN: [id number] Date of Birth: 02-Dec-1985 Referring Provider (PT): de [place], Blondell Reveal, MD   Encounter Date: 12/16/2020   PT End of Session - 12/17/20 1941     Visit Number 26    Number of Visits 29    Date for PT Re-Evaluation 12/13/20    Authorization Type [facility]    PT Start Time 1600    PT Stop Time 1643    PT Time Calculation (min) 43 min    Activity Tolerance Patient tolerated treatment well    Behavior During Therapy [facility] for tasks assessed/performed             History reviewed. No pertinent past medical history.  Past Surgical History:  Procedure Laterality Date   KNEE ARTHROSCOPY  1999   SPINE SURGERY  12/29/2005    There were no vitals filed for this visit.   Subjective Assessment - 12/16/20 1607     Subjective The patient reports he has been doing well. he has been doing his pre-running exercises without any pain. he has been walking for 38 minutes without pain.    How long can you sit comfortably? it's fine, I know I need to get up and move more    How long can you walk comfortably? 40 min    Patient Stated Goals return to PLOF, core strength, balance- ride mobile bike    Currently in Pain? No/denies               ike 5 min  Bridge with Band 2x15 blue band   Eccentric step down 2 inch 2x15  Cone drill 2x10 each leg   Pallof press 3x10 green  Chop 3x10 each direction green   Reviewed how each exercise pertains to return to running                         PT Education - 12/16/20 1609     Education Details reviewed HEP and symptom mangement    Person(s) Educated Patient    Methods Explanation;Demonstration;Tactile cues;Verbal cues    Comprehension Verbalized understanding;Returned demonstration;Verbal  cues required;Tactile cues required                 PT Long Term Goals - 10/30/20 1604       PT LONG TERM GOAL #1   Title resolution of burning in Rt LE    Status New      PT LONG TERM GOAL #2   Title pt will be able to return to running    Baseline did return but pain returned and now working to resolve      PT LONG TERM GOAL #3   Title demo gross 5/5 hip strength for support to lumbopelvic region    Baseline see flowsheet    Status On-going      PT LONG TERM GOAL #4   Title lifting floor to waist height    Status Achieved      PT LONG TERM GOAL #5   Title pusing/pulling for lawn mowing    Status Achieved      Additional Long Term Goals   Additional Long Term Goals Yes      PT LONG TERM GOAL #6   Title pt will improve balance in order to safely ride a  mobile road bike    Baseline lacking balance on Rt vs Lt    Status On-going      PT LONG TERM GOAL #7   Title pt will tolerate a seated position for timeframe required to complete patient visits    Baseline unable to tolerate sitting, was able to sit for 3 patients yesterday but was very sore after    Time 6    Period Weeks    Status New    Target Date 12/13/20                   Plan - 12/16/20 1613     Clinical Impression Statement The patient is making good progress He tolerated exercises well. he was given single leg stability exercises, as well as rotational stability exercises. he tolerated well. Therapy advanced his bands with the exercises that he has. He will take 2 more weeks and continue to work on his exercises.    Personal Factors and Comorbidities Comorbidity 2;Time since onset of injury/illness/exacerbation    Comorbidities runner with h/o injuries, h/o spinal surgery    Examination-Activity Limitations Bed Mobility;Bend;Sit;Sleep;Stand;Lift;Squat;Stairs;Caring for Others;Carry;Locomotion Level    Stability/Clinical Decision Making Unstable/Unpredictable    Clinical Decision Making  Moderate    Rehab Potential Good    PT Frequency 1x / week    PT Duration 6 weeks    PT Treatment/Interventions ADLs/Self Care Home Management;Cryotherapy;Aquatic Therapy;Electrical Stimulation;Gait training;Ultrasound;Traction;Moist Heat;Iontophoresis 4mg /ml Dexamethasone;Stair training;Functional mobility training;Therapeutic activities;Therapeutic exercise;Neuromuscular re-education;Manual techniques;Patient/family education;Passive range of motion;Dry needling;Taping;Joint Manipulations;Spinal Manipulations;Balance training    PT Next Visit Plan cont glut/core strengthening    PT Home Exercise Plan [id number]    Consulted and Agree with Plan of Care Patient             Patient will benefit from skilled therapeutic intervention in order to improve the following deficits and impairments:  Decreased range of motion, Difficulty walking, Increased muscle spasms, Decreased activity tolerance, Pain, Improper body mechanics, Impaired flexibility, Decreased strength, Postural dysfunction, Decreased balance  Visit Diagnosis: Difficulty in walking, not elsewhere classified  Other abnormalities of gait and mobility  Acute right-sided low back pain, unspecified whether sciatica present  Pain in right hip  Chronic pain of right knee     Problem List Patient Active Problem List   Diagnosis Date Noted   Gross hematuria 10/25/2020   Ischial bursitis of left side 10/08/2020   Spinal stenosis at L4-L5 level 05/29/2020   Lumbosacral radiculopathy 05/23/2020   Low back pain 04/24/2020   Hip pain 04/24/2020   Strain of calf muscle 04/24/2020   Prediabetes 04/24/2020   Hyperlipidemia 04/24/2020   Transaminitis 04/24/2020    Carney Living, PT 12/17/2020, 7:48 PM  [facility] [address] [place], [place], [address] Phone: [phone]   Fax:  [phone]  Name: Justin Morrow MRN: [id number] Date of Birth: November 19, 1985

## 2020-12-18 ENCOUNTER — Encounter (HOSPITAL_BASED_OUTPATIENT_CLINIC_OR_DEPARTMENT_OTHER): Payer: Self-pay | Admitting: Physical Therapy

## 2020-12-24 ENCOUNTER — Encounter (HOSPITAL_BASED_OUTPATIENT_CLINIC_OR_DEPARTMENT_OTHER): Payer: Self-pay | Admitting: Physical Therapy

## 2020-12-31 ENCOUNTER — Ambulatory Visit (HOSPITAL_BASED_OUTPATIENT_CLINIC_OR_DEPARTMENT_OTHER): Payer: No Typology Code available for payment source | Admitting: Physical Therapy

## 2020-12-31 ENCOUNTER — Encounter (HOSPITAL_BASED_OUTPATIENT_CLINIC_OR_DEPARTMENT_OTHER): Payer: Self-pay | Admitting: Physical Therapy

## 2020-12-31 ENCOUNTER — Other Ambulatory Visit: Payer: Self-pay

## 2020-12-31 DIAGNOSIS — R2689 Other abnormalities of gait and mobility: Secondary | ICD-10-CM

## 2020-12-31 DIAGNOSIS — R262 Difficulty in walking, not elsewhere classified: Secondary | ICD-10-CM

## 2020-12-31 DIAGNOSIS — M545 Low back pain, unspecified: Secondary | ICD-10-CM

## 2021-01-01 ENCOUNTER — Encounter (HOSPITAL_BASED_OUTPATIENT_CLINIC_OR_DEPARTMENT_OTHER): Payer: Self-pay | Admitting: Physical Therapy

## 2021-01-01 NOTE — Therapy (Signed)
[facility] [address] [place], [place], [address] Phone: [phone]   Fax:  [phone]  Physical Therapy Treatment  Patient Details  Name: Justin Morrow MRN: [id number] Date of Birth: [birth year] Referring Provider (PT): de [place], Blondell Reveal, MD   Encounter Date: 12/31/2020   PT End of Session - 12/31/20 1610     Visit Number 27    Number of Visits 29    Date for PT Re-Evaluation 12/13/20    Authorization Type [facility]    PT Start Time 1602    PT Stop Time 1644    PT Time Calculation (min) 42 min    Activity Tolerance Patient tolerated treatment well    Behavior During Therapy [facility] for tasks assessed/performed             History reviewed. No pertinent past medical history.  Past Surgical History:  Procedure Laterality Date   KNEE ARTHROSCOPY  1999   SPINE SURGERY  12/29/2005    There were no vitals filed for this visit.   Subjective Assessment - 12/31/20 1608     Subjective The patient has had some tightness in his piriformis on both sides. He has been working on his exercises.    How long can you sit comfortably? it's fine, I know I need to get up and move more    How long can you walk comfortably? 40 min    Patient Stated Goals return to PLOF, core strength, balance- ride mobile bike    Currently in Pain? No/denies                                 Trigger Point Dry Needling - 01/01/21 0001     Consent Given? Yes    Other Dry Needling 2 spots in the left and right pirifromis    Piriformis Response Twitch response elicited;Palpable increased muscle length            Manual Therapy: Trigger point release to gluteal and lumbar spine;skilled palpation of trigger points and monitoring during needling LAD grade II and III   Bridging 3x10; Hip abduction 3x10 green        PT Education - 12/31/20 1610     Education Details HEP and symptom management    Person(s)  Educated Patient    Methods Explanation;Demonstration;Tactile cues;Verbal cues    Comprehension Verbalized understanding;Returned demonstration;Verbal cues required;Tactile cues required                 PT Long Term Goals - 10/30/20 1604       PT LONG TERM GOAL #1   Title resolution of burning in Rt LE    Status New      PT LONG TERM GOAL #2   Title pt will be able to return to running    Baseline did return but pain returned and now working to resolve      PT LONG TERM GOAL #3   Title demo gross 5/5 hip strength for support to lumbopelvic region    Baseline see flowsheet    Status On-going      PT LONG TERM GOAL #4   Title lifting floor to waist height    Status Achieved      PT LONG TERM GOAL #5   Title pusing/pulling for lawn mowing    Status Achieved      Additional Long Term Goals   Additional Long  Term Goals Yes      PT LONG TERM GOAL #6   Title pt will improve balance in order to safely ride a mobile road bike    Baseline lacking balance on Rt vs Lt    Status On-going      PT LONG TERM GOAL #7   Title pt will tolerate a seated position for timeframe required to complete patient visits    Baseline unable to tolerate sitting, was able to sit for 3 patients yesterday but was very sore after    Time 6    Period Weeks    Status New    Target Date 12/13/20                   Plan - 01/01/21 1153     Clinical Impression Statement Therapy focuased on manual therapy and needling to improved the progressive feeling of tightness in his piriformis over the past 2 weeks. We had a great twitch respose on both sides with needling. Therpay performed trigger point release to gluteals and lumbar spine. he was advised to perfrom basic exercises over the next few days.    Personal Factors and Comorbidities Comorbidity 2;Time since onset of injury/illness/exacerbation    Comorbidities runner with h/o injuries, h/o spinal surgery    Examination-Activity  Limitations Bed Mobility;Bend;Sit;Sleep;Stand;Lift;Squat;Stairs;Caring for Others;Carry;Locomotion Level    Examination-Participation Restrictions Occupation;Other;Yard Work;Cleaning    Stability/Clinical Decision Making Unstable/Unpredictable    Clinical Decision Making Moderate    Rehab Potential Good    PT Frequency 1x / week    PT Duration 6 weeks    PT Treatment/Interventions ADLs/Self Care Home Management;Cryotherapy;Aquatic Therapy;Electrical Stimulation;Gait training;Ultrasound;Traction;Moist Heat;Iontophoresis 4mg /ml Dexamethasone;Stair training;Functional mobility training;Therapeutic activities;Therapeutic exercise;Neuromuscular re-education;Manual techniques;Patient/family education;Passive range of motion;Dry needling;Taping;Joint Manipulations;Spinal Manipulations;Balance training    PT Next Visit Plan cont glut/core strengthening    PT Home Exercise Plan [id number]    Consulted and Agree with Plan of Care Patient             Patient will benefit from skilled therapeutic intervention in order to improve the following deficits and impairments:  Decreased range of motion, Difficulty walking, Increased muscle spasms, Decreased activity tolerance, Pain, Improper body mechanics, Impaired flexibility, Decreased strength, Postural dysfunction, Decreased balance  Visit Diagnosis: Difficulty in walking, not elsewhere classified  Other abnormalities of gait and mobility  Acute right-sided low back pain, unspecified whether sciatica present     Problem List Patient Active Problem List   Diagnosis Date Noted   Gross hematuria 10/25/2020   Ischial bursitis of left side 10/08/2020   Spinal stenosis at L4-L5 level 05/29/2020   Lumbosacral radiculopathy 05/23/2020   Low back pain 04/24/2020   Hip pain 04/24/2020   Strain of calf muscle 04/24/2020   Prediabetes 04/24/2020   Hyperlipidemia 04/24/2020   Transaminitis 04/24/2020    Carney Living, PT 01/01/2021, 12:02  PM  [facility] [address] [place], [place], [address] Phone: [phone]   Fax:  [phone]  Name: Justin Morrow MRN: [id number] Date of Birth: 09/11/1985

## 2021-01-07 ENCOUNTER — Encounter (HOSPITAL_BASED_OUTPATIENT_CLINIC_OR_DEPARTMENT_OTHER): Payer: Self-pay | Admitting: Physical Therapy

## 2021-02-07 ENCOUNTER — Other Ambulatory Visit: Payer: Self-pay

## 2021-02-07 ENCOUNTER — Ambulatory Visit (HOSPITAL_BASED_OUTPATIENT_CLINIC_OR_DEPARTMENT_OTHER): Payer: No Typology Code available for payment source | Attending: Family Medicine | Admitting: Physical Therapy

## 2021-02-07 ENCOUNTER — Encounter (HOSPITAL_BASED_OUTPATIENT_CLINIC_OR_DEPARTMENT_OTHER): Payer: Self-pay | Admitting: Physical Therapy

## 2021-02-07 DIAGNOSIS — M25561 Pain in right knee: Secondary | ICD-10-CM | POA: Diagnosis present

## 2021-02-07 DIAGNOSIS — G8929 Other chronic pain: Secondary | ICD-10-CM | POA: Insufficient documentation

## 2021-02-07 DIAGNOSIS — M545 Low back pain, unspecified: Secondary | ICD-10-CM | POA: Insufficient documentation

## 2021-02-07 DIAGNOSIS — R2689 Other abnormalities of gait and mobility: Secondary | ICD-10-CM | POA: Diagnosis present

## 2021-02-07 DIAGNOSIS — M25551 Pain in right hip: Secondary | ICD-10-CM | POA: Diagnosis present

## 2021-02-07 DIAGNOSIS — R262 Difficulty in walking, not elsewhere classified: Secondary | ICD-10-CM | POA: Insufficient documentation

## 2021-02-07 NOTE — Therapy (Signed)
[facility] [address] [place], [place], [address] Phone: [phone]   Fax:  [phone]  Physical Therapy Treatment  Patient Details  Name: Justin Morrow MRN: [id number] Date of Birth: 1986/01/06 Referring Provider (PT): de [place], Blondell Reveal, MD   Encounter Date: 02/07/2021   PT End of Session - 02/09/21 0852     Number of Visits 36    Date for PT Re-Evaluation 04/06/21    Authorization Type [facility]    PT Start Time 1600    PT Stop Time 1643    PT Time Calculation (min) 43 min    Activity Tolerance Patient tolerated treatment well    Behavior During Therapy [facility] for tasks assessed/performed             History reviewed. No pertinent past medical history.  Past Surgical History:  Procedure Laterality Date   KNEE ARTHROSCOPY  1999   SPINE SURGERY  12/29/2005    There were no vitals filed for this visit.   Subjective Assessment - 02/09/21 0823     Subjective The patinet is increasing his walking speed to where he is near a running pace. his back has respoend well. He has been working on his exercises consitently.    How long can you sit comfortably? it's fine, I know I need to get up and move more    How long can you walk comfortably? 40 min    Patient Stated Goals return to PLOF, core strength, balance- ride mobile bike    Currently in Pain? No/denies              hip flexion  right 41  Left 44.8   Right 43.4  Left 54.2    Right 40.4  Left 59.0   Full PROM   Plapation Tenderness to palpation in the left hip  Manual: performed manual trigger point release to these areas for a short period of time.   Bridge with band 2x15 blue band   Supine clamshell 3x15 blue   Standing cone drill to table 3x10   Standing d2 reach 1lb weight 2x10  Reviewed lateral band and monster walk for home.                         PT Education - 02/09/21 [phone]     Education Details  reviewed HEP and symptom management    Person(s) Educated Patient    Methods Explanation;Tactile cues;Verbal cues;Demonstration    Comprehension Returned demonstration;Verbal cues required;Verbalized understanding;Tactile cues required                 PT Long Term Goals - 10/30/20 1604       PT LONG TERM GOAL #1   Title resolution of burning in Rt LE    Status New      PT LONG TERM GOAL #2   Title pt will be able to return to running    Baseline did return but pain returned and now working to resolve      PT LONG TERM GOAL #3   Title demo gross 5/5 hip strength for support to lumbopelvic region    Baseline see flowsheet    Status On-going      PT LONG TERM GOAL #4   Title lifting floor to waist height    Status Achieved      PT LONG TERM GOAL #5   Title pusing/pulling for lawn mowing    Status Achieved  Additional Long Term Goals   Additional Long Term Goals Yes      PT LONG TERM GOAL #6   Title pt will improve balance in order to safely ride a mobile road bike    Baseline lacking balance on Rt vs Lt    Status On-going      PT LONG TERM GOAL #7   Title pt will tolerate a seated position for timeframe required to complete patient visits    Baseline unable to tolerate sitting, was able to sit for 3 patients yesterday but was very sore after    Time 6    Period Weeks    Status New    Target Date 12/13/20                   Plan - 02/09/21 0848     Clinical Impression Statement The patient continues to progress his strengthneing activity. Today we measured his hip and knee strength. His ROM is full. He continues to have spasming in his left hip although his wekaness is more on the right.  His hip flexion strength is WNL His abductor and knee extensor strength are stronger on the left. We reviewed a program that focused on those two muscle groups. He will continue to work on tose muscle groups. We will continue to see hism 1x every 2-3 weeks to monitor  his progress towatds retrun to running.Therapy added a LAQ to work on his gross quad strength. We also added a 1/2 d2 single leg reachto work on the next phase of running. He tolerated well    Personal Factors and Comorbidities Comorbidity 2;Time since onset of injury/illness/exacerbation    Comorbidities runner with h/o injuries, h/o spinal surgery    Examination-Activity Limitations Bed Mobility;Bend;Sit;Sleep;Stand;Lift;Squat;Stairs;Caring for Others;Carry;Locomotion Level    Examination-Participation Restrictions Occupation;Other;Yard Work;Cleaning    Stability/Clinical Decision Making Unstable/Unpredictable    Rehab Potential Good    PT Frequency 1x / week    PT Duration 6 weeks    PT Treatment/Interventions ADLs/Self Care Home Management;Cryotherapy;Aquatic Therapy;Electrical Stimulation;Gait training;Ultrasound;Traction;Moist Heat;Iontophoresis 4mg /ml Dexamethasone;Stair training;Functional mobility training;Therapeutic activities;Therapeutic exercise;Neuromuscular re-education;Manual techniques;Patient/family education;Passive range of motion;Dry needling;Taping;Joint Manipulations;Spinal Manipulations;Balance training    PT Home Exercise Plan [id number]/ return to running [place] and Agree with Plan of Care Patient             Patient will benefit from skilled therapeutic intervention in order to improve the following deficits and impairments:  Decreased range of motion, Difficulty walking, Increased muscle spasms, Decreased activity tolerance, Pain, Improper body mechanics, Impaired flexibility, Decreased strength, Postural dysfunction, Decreased balance  Visit Diagnosis: Difficulty in walking, not elsewhere classified  Other abnormalities of gait and mobility  Acute right-sided low back pain, unspecified whether sciatica present  Pain in right hip  Chronic pain of right knee     Problem List Patient Active Problem List   Diagnosis Date Noted   Gross  hematuria 10/25/2020   Ischial bursitis of left side 10/08/2020   Spinal stenosis at L4-L5 level 05/29/2020   Lumbosacral radiculopathy 05/23/2020   Low back pain 04/24/2020   Hip pain 04/24/2020   Strain of calf muscle 04/24/2020   Prediabetes 04/24/2020   Hyperlipidemia 04/24/2020   Transaminitis 04/24/2020    Carney Living, PT 02/09/2021, 8:52 AM  [facility] [place], [place], [address] Phone: [phone]   Fax:  [phone]  Name: Justin Morrow MRN: [id number] Date of Birth: 1985-05-04

## 2021-02-09 ENCOUNTER — Encounter (HOSPITAL_BASED_OUTPATIENT_CLINIC_OR_DEPARTMENT_OTHER): Payer: Self-pay | Admitting: Physical Therapy

## 2021-02-09 NOTE — Addendum Note (Signed)
Addended by: Carney Living on: 02/09/2021 09:01 AM   Modules accepted: Orders

## 2021-02-19 ENCOUNTER — Encounter (HOSPITAL_BASED_OUTPATIENT_CLINIC_OR_DEPARTMENT_OTHER): Payer: Self-pay | Admitting: Physical Therapy

## 2021-02-19 ENCOUNTER — Other Ambulatory Visit: Payer: Self-pay

## 2021-02-19 ENCOUNTER — Ambulatory Visit (HOSPITAL_BASED_OUTPATIENT_CLINIC_OR_DEPARTMENT_OTHER): Payer: No Typology Code available for payment source | Attending: Family Medicine | Admitting: Physical Therapy

## 2021-02-19 DIAGNOSIS — R2689 Other abnormalities of gait and mobility: Secondary | ICD-10-CM | POA: Diagnosis present

## 2021-02-19 DIAGNOSIS — M25551 Pain in right hip: Secondary | ICD-10-CM | POA: Insufficient documentation

## 2021-02-19 DIAGNOSIS — R262 Difficulty in walking, not elsewhere classified: Secondary | ICD-10-CM | POA: Diagnosis not present

## 2021-02-19 DIAGNOSIS — M545 Low back pain, unspecified: Secondary | ICD-10-CM | POA: Insufficient documentation

## 2021-02-19 DIAGNOSIS — M25561 Pain in right knee: Secondary | ICD-10-CM | POA: Insufficient documentation

## 2021-02-19 DIAGNOSIS — G8929 Other chronic pain: Secondary | ICD-10-CM | POA: Diagnosis present

## 2021-02-20 NOTE — Therapy (Signed)
[facility] [place], [place], [address] Phone: [phone]   Fax:  [phone]  Physical Therapy Treatment  Patient Details  Name: Justin Morrow MRN: [id number] Date of Birth: Jul 16, 1985 Referring Provider (PT): de [place], Blondell Reveal, MD   Encounter Date: 02/19/2021   PT End of Session - 02/19/21 1614     Visit Number 29    Number of Visits 36    Date for PT Re-Evaluation 04/06/21    Authorization Type [facility]    PT Start Time 1600    PT Stop Time 1640    PT Time Calculation (min) 40 min    Activity Tolerance Patient tolerated treatment well    Behavior During Therapy [facility] for tasks assessed/performed             History reviewed. No pertinent past medical history.  Past Surgical History:  Procedure Laterality Date   KNEE ARTHROSCOPY  1999   SPINE SURGERY  12/29/2005    There were no vitals filed for this visit.   Subjective Assessment - 02/19/21 1609     Subjective The patient is making good progress. He hasn't had any pain. He has been working on his exercises.    How long can you sit comfortably? it's fine, I know I need to get up and move more    How long can you walk comfortably? 40 min    Patient Stated Goals return to PLOF, core strength, balance- ride mobile bike    Currently in Pain? No/denies                 supine [id number]  Piriformis stretch 2x20 sec hold bilateral   Supine bridge with black band x20  Supine hip abduction black band x20  Supine clam shell black x20   Pallof press 10 lbs 2x10 bilateral Chop 2x10 10lbs   T-bar cable walk x10 20 lbs                               PT Education - 02/19/21 1611     Education Details Reviewed base exercise program    Person(s) Educated Patient    Methods Explanation;Demonstration;Tactile cues;Verbal cues    Comprehension Verbalized understanding;Verbal cues required;Returned demonstration;Tactile  cues required                 PT Long Term Goals - 10/30/20 1604       PT LONG TERM GOAL #1   Title resolution of burning in Rt LE    Status New      PT LONG TERM GOAL #2   Title pt will be able to return to running    Baseline did return but pain returned and now working to resolve      PT LONG TERM GOAL #3   Title demo gross 5/5 hip strength for support to lumbopelvic region    Baseline see flowsheet    Status On-going      PT LONG TERM GOAL #4   Title lifting floor to waist height    Status Achieved      PT LONG TERM GOAL #5   Title pusing/pulling for lawn mowing    Status Achieved      Additional Long Term Goals   Additional Long Term Goals Yes      PT LONG TERM GOAL #6   Title pt will improve balance in order to safely ride a  mobile road bike    Baseline lacking balance on Rt vs Lt    Status On-going      PT LONG TERM GOAL #7   Title pt will tolerate a seated position for timeframe required to complete patient visits    Baseline unable to tolerate sitting, was able to sit for 3 patients yesterday but was very sore after    Time 6    Period Weeks    Status New    Target Date 12/13/20                   Plan - 02/20/21 1544     Clinical Impression Statement Therap reviewed rotational strenghtneing exericses for his core and hips in preperation for return to running. He also perfromed cable walks for strengthening. He had no significant increase in pain. Overall he is making good progress. He will begin running and biking over the next few weeks. If he does well then he is apporaching discharge.    Personal Factors and Comorbidities Comorbidity 2;Time since onset of injury/illness/exacerbation    Comorbidities runner with h/o injuries, h/o spinal surgery    Examination-Activity Limitations Bed Mobility;Bend;Sit;Sleep;Stand;Lift;Squat;Stairs;Caring for Others;Carry;Locomotion Level    Examination-Participation Restrictions Occupation;Other;Yard  Work;Cleaning    Stability/Clinical Decision Making Unstable/Unpredictable    Clinical Decision Making Moderate    Rehab Potential Good    PT Frequency 1x / week    PT Duration 6 weeks    PT Treatment/Interventions ADLs/Self Care Home Management;Cryotherapy;Aquatic Therapy;Electrical Stimulation;Gait training;Ultrasound;Traction;Moist Heat;Iontophoresis 4mg /ml Dexamethasone;Stair training;Functional mobility training;Therapeutic activities;Therapeutic exercise;Neuromuscular re-education;Manual techniques;Patient/family education;Passive range of motion;Dry needling;Taping;Joint Manipulations;Spinal Manipulations;Balance training    PT Next Visit Plan cont glut/core strengthening    PT Home Exercise Plan [id number]/ return to running HEP [id number]    Consulted and Agree with Plan of Care Patient             Patient will benefit from skilled therapeutic intervention in order to improve the following deficits and impairments:  Decreased range of motion, Difficulty walking, Increased muscle spasms, Decreased activity tolerance, Pain, Improper body mechanics, Impaired flexibility, Decreased strength, Postural dysfunction, Decreased balance  Visit Diagnosis: Difficulty in walking, not elsewhere classified  Other abnormalities of gait and mobility  Acute right-sided low back pain, unspecified whether sciatica present  Pain in right hip  Chronic pain of right knee     Problem List Patient Active Problem List   Diagnosis Date Noted   Gross hematuria 10/25/2020   Ischial bursitis of left side 10/08/2020   Spinal stenosis at L4-L5 level 05/29/2020   Lumbosacral radiculopathy 05/23/2020   Low back pain 04/24/2020   Hip pain 04/24/2020   Strain of calf muscle 04/24/2020   Prediabetes 04/24/2020   Hyperlipidemia 04/24/2020   Transaminitis 04/24/2020    Carney Living, PT 02/20/2021, 3:46 PM  [facility] [address] [place],  [place], [address] Phone: [phone]   Fax:  [phone]  Name: Justin Morrow MRN: [id number] Date of Birth: 1985-10-21

## 2021-03-12 ENCOUNTER — Ambulatory Visit (HOSPITAL_BASED_OUTPATIENT_CLINIC_OR_DEPARTMENT_OTHER): Payer: No Typology Code available for payment source | Admitting: Physical Therapy

## 2021-03-24 ENCOUNTER — Other Ambulatory Visit: Payer: Self-pay

## 2021-03-24 ENCOUNTER — Ambulatory Visit (HOSPITAL_BASED_OUTPATIENT_CLINIC_OR_DEPARTMENT_OTHER): Payer: No Typology Code available for payment source | Attending: Family Medicine | Admitting: Physical Therapy

## 2021-03-24 DIAGNOSIS — R2689 Other abnormalities of gait and mobility: Secondary | ICD-10-CM | POA: Diagnosis present

## 2021-03-24 DIAGNOSIS — M545 Low back pain, unspecified: Secondary | ICD-10-CM | POA: Diagnosis present

## 2021-03-24 DIAGNOSIS — R262 Difficulty in walking, not elsewhere classified: Secondary | ICD-10-CM | POA: Diagnosis not present

## 2021-03-24 DIAGNOSIS — M25561 Pain in right knee: Secondary | ICD-10-CM | POA: Insufficient documentation

## 2021-03-24 DIAGNOSIS — G8929 Other chronic pain: Secondary | ICD-10-CM | POA: Diagnosis present

## 2021-03-24 DIAGNOSIS — M25551 Pain in right hip: Secondary | ICD-10-CM | POA: Insufficient documentation

## 2021-03-25 NOTE — Therapy (Signed)
[facility] [address] ?[place], [place], [address] ?Phone: [phone]   Fax:  [phone] ? ?Physical Therapy Treatment/Discharge  ? ?Patient Details  ?Name: Justin Morrow ?MRN: [id number] ?Date of Birth: 11-Feb-1985 ?Referring Provider (PT): de [place], Blondell Reveal, MD ? ? ?Encounter Date: 03/24/2021 ? ? PT End of Session - 03/25/21 0753   ? ? Visit Number 30   ? Number of Visits 36   ? Date for PT Re-Evaluation 04/06/21   ? Authorization Type [facility] FOCUS   ? PT Start Time 1600   ? PT Stop Time 1630   did nto require a full visit 2nd to D/C  ? PT Time Calculation (min) 30 min   ? Activity Tolerance Patient tolerated treatment well   ? Behavior During Therapy [facility] for tasks assessed/performed   ? ?  ?  ? ?  ? ? ?No past medical history on file. ? ?Past Surgical History:  ?Procedure Laterality Date  ? KNEE ARTHROSCOPY  1999  ? SPINE SURGERY  12/29/2005  ? ? ?There were no vitals filed for this visit. ? ? Subjective Assessment - 03/25/21 0751   ? ? Subjective The patient reports he has retuened to running 1-2 miles at a slow pace. he has had very little pain. he did have a twinge while lifting a couch. He has otherwise done well. he does feel a twinge at times in his achillies.   ? How long can you sit comfortably? it's fine, I know I need to get up and move more   ? How long can you walk comfortably? 40 min   ? Patient Stated Goals return to PLOF, core strength, balance- ride mobile bike   ? Currently in Pain? No/denies   ? ?  ?  ? ?  ? ? ? ? ? ?Leg press 70 lbs 3x10  ?Reviewed use of life fitness as well  ?Hip abduction 3x15 55 llbs  ? ?Row machine 3x10 30 lbs with abdominal bracing  ?Knee extension machine 3x15 25 lbs  bilateral  ? ?Reviewed proper technique for gastroc/soleus stretch  ? ?Reviewed final HEP ? ? ? ? ? ? ? ? ? ? ? ? ? ? ? ? ? ? ? ? ? ? ? PT Education - 03/25/21 0753   ? ? Education Details reviewed gym exercises and activity   ? Person(s) Educated  Patient   ? Methods Explanation;Demonstration;Tactile cues;Verbal cues   ? Comprehension Verbalized understanding;Returned demonstration;Verbal cues required;Tactile cues required   ? ?  ?  ? ?  ? ? ? ? ? ? PT Long Term Goals - 03/25/21 0759   ? ?  ? PT LONG TERM GOAL #1  ? Title resolution of burning in Rt LE   ? Baseline no burning   ? Time 4   ? Period Weeks   ? Status Achieved   ? Target Date 06/21/20   ?  ? PT LONG TERM GOAL #2  ? Title pt will be able to return to running   ? Baseline reutened to running   ? Time 6   ? Period Weeks   ? Status Revised   ? Target Date 09/21/20   ?  ? PT LONG TERM GOAL #3  ? Title demo gross 5/5 hip strength for support to lumbopelvic region   ? Baseline significant improvement in strength   ? Time 6   ? Period Weeks   ? Status Achieved   ? Target Date  09/21/20   ?  ? PT LONG TERM GOAL #4  ? Title lifting floor to waist height   ? Baseline will cont to educate   ? Time 5   ? Period Weeks   ? Status Achieved   ? Target Date 07/26/20   ?  ? PT LONG TERM GOAL #5  ? Title pusing/pulling for lawn mowing   ? Baseline able to do, limited due to lack of rain   ? Time 5   ? Period Weeks   ? Status Achieved   ? Target Date 07/26/20   ?  ? PT LONG TERM GOAL #6  ? Title pt will improve balance in order to safely ride a mobile road bike   ? Baseline lacking balance on Rt vs Lt   ? Time 6   ? Period Weeks   ? Status Achieved   ? Target Date 09/21/20   ?  ? PT LONG TERM GOAL #7  ? Title pt will tolerate a seated position for timeframe required to complete patient visits   ? Baseline sitting without a problem   ? Time 6   ? Period Weeks   ? Status Achieved   ? Target Date 12/13/20   ? ?  ?  ? ?  ? ? ? ? ? ? ? ? Plan - 03/25/21 0757   ? ? Clinical Impression Statement The patient has reached his goals for therapy. He has retuened to running without any significant pain> his gluteal strength has inporved overall. he has made great progress. Per last strength testing he has improved strength with  all muslce gropus. Therapy reviewed gym program with him. He plans on getting back into the gym at some point. Therapy reviewed gym exercises and how they can improve his back and core strengthening. See below for goal speicific strength.   ? Personal Factors and Comorbidities Comorbidity 2;Time since onset of injury/illness/exacerbation   ? Comorbidities runner with h/o injuries, h/o spinal surgery   ? Examination-Activity Limitations Bed Mobility;Bend;Sit;Sleep;Stand;Lift;Squat;Stairs;Caring for Others;Carry;Locomotion Level   ? Examination-Participation Restrictions Occupation;Other;Yard Work;Cleaning   ? Stability/Clinical Decision Making Unstable/Unpredictable   ? Clinical Decision Making Moderate   ? Rehab Potential Good   ? PT Frequency 1x / week   ? PT Duration 6 weeks   ? PT Treatment/Interventions ADLs/Self Care Home Management;Cryotherapy;Aquatic Therapy;Electrical Stimulation;Gait training;Ultrasound;Traction;Moist Heat;Iontophoresis 13m/ml Dexamethasone;Stair training;Functional mobility training;Therapeutic activities;Therapeutic exercise;Neuromuscular re-education;Manual techniques;Patient/family education;Passive range of motion;Dry needling;Taping;Joint Manipulations;Spinal Manipulations;Balance training   ? PT Next Visit Plan cont glut/core strengthening   ? PT Home Exercise Plan [id number]/ return to running HEP [id number]   ? Consulted and Agree with Plan of Care Patient   ? ?  ?  ? ?  ? ? ?Patient will benefit from skilled therapeutic intervention in order to improve the following deficits and impairments:  Decreased range of motion, Difficulty walking, Increased muscle spasms, Decreased activity tolerance, Pain, Improper body mechanics, Impaired flexibility, Decreased strength, Postural dysfunction, Decreased balance ? ?Visit Diagnosis: ?Difficulty in walking, not elsewhere classified ? ?Other abnormalities of gait and mobility ? ?Acute right-sided low back pain, unspecified whether sciatica  present ? ?Pain in right hip ? ?Chronic pain of right knee ? ? ?PHYSICAL THERAPY DISCHARGE SUMMARY ? ?Visits from Start of Care: 30 ? ?Current functional level related to goals / functional outcomes: ?Very little pain; has returned to running  ?  ?Remaining deficits: ?Pain at times ?  ?Education / Equipment: ?HEP   ? ?Patient agrees to discharge.  Patient goals were met. Patient is being discharged due to meeting the stated rehab goals.  ? ?Problem List ?Patient Active Problem List  ? Diagnosis Date Noted  ? Gross hematuria 10/25/2020  ? Ischial bursitis of left side 10/08/2020  ? Spinal stenosis at L4-L5 level 05/29/2020  ? Lumbosacral radiculopathy 05/23/2020  ? Low back pain 04/24/2020  ? Hip pain 04/24/2020  ? Strain of calf muscle 04/24/2020  ? Prediabetes 04/24/2020  ? Hyperlipidemia 04/24/2020  ? Transaminitis 04/24/2020  ? ? ?Carney Living, PT ?03/25/2021, 8:18 AM ? ?[facility] ?[place], [place], [address] ?Phone: [phone]   Fax:  [phone] ? ?Name: Liberty Stead ?MRN: [id number] ?Date of Birth: 1985-03-28 ? ? ? ?

## 2021-04-21 ENCOUNTER — Ambulatory Visit (HOSPITAL_BASED_OUTPATIENT_CLINIC_OR_DEPARTMENT_OTHER): Payer: No Typology Code available for payment source

## 2021-04-21 ENCOUNTER — Other Ambulatory Visit (HOSPITAL_BASED_OUTPATIENT_CLINIC_OR_DEPARTMENT_OTHER): Payer: Self-pay

## 2021-04-21 DIAGNOSIS — Z0000 Encounter for general adult medical examination without abnormal findings: Secondary | ICD-10-CM

## 2021-04-22 LAB — CBC WITH DIFFERENTIAL/PLATELET
Basophils Absolute: 0.1 10*3/uL (ref 0.0–0.2)
Basos: 1 %
EOS (ABSOLUTE): 0.2 10*3/uL (ref 0.0–0.4)
Eos: 3 %
Hematocrit: 43.7 % (ref 37.5–51.0)
Hemoglobin: 14.5 g/dL (ref 13.0–17.7)
Immature Grans (Abs): 0 10*3/uL (ref 0.0–0.1)
Immature Granulocytes: 0 %
Lymphocytes Absolute: 1.9 10*3/uL (ref 0.7–3.1)
Lymphs: 27 %
MCH: 28.8 pg (ref 26.6–33.0)
MCHC: 33.2 g/dL (ref 31.5–35.7)
MCV: 87 fL (ref 79–97)
Monocytes Absolute: 0.4 10*3/uL (ref 0.1–0.9)
Monocytes: 6 %
Neutrophils Absolute: 4.6 10*3/uL (ref 1.4–7.0)
Neutrophils: 63 %
Platelets: 308 10*3/uL (ref 150–450)
RBC: 5.03 x10E6/uL (ref 4.14–5.80)
RDW: 13.2 % (ref 11.6–15.4)
WBC: 7.2 10*3/uL (ref 3.4–10.8)

## 2021-04-22 LAB — COMPREHENSIVE METABOLIC PANEL
ALT: 38 IU/L (ref 0–44)
AST: 27 IU/L (ref 0–40)
Albumin/Globulin Ratio: 2.1 (ref 1.2–2.2)
Albumin: 5 g/dL (ref 4.0–5.0)
Alkaline Phosphatase: 87 IU/L (ref 44–121)
BUN/Creatinine Ratio: 14 (ref 9–20)
BUN: 12 mg/dL (ref 6–20)
Bilirubin Total: 0.9 mg/dL (ref 0.0–1.2)
CO2: 25 mmol/L (ref 20–29)
Calcium: 10.2 mg/dL (ref 8.7–10.2)
Chloride: 102 mmol/L (ref 96–106)
Creatinine, Ser: 0.87 mg/dL (ref 0.76–1.27)
Globulin, Total: 2.4 g/dL (ref 1.5–4.5)
Glucose: 94 mg/dL (ref 70–99)
Potassium: 4.3 mmol/L (ref 3.5–5.2)
Sodium: 140 mmol/L (ref 134–144)
Total Protein: 7.4 g/dL (ref 6.0–8.5)
eGFR: 114 mL/min/{1.73_m2} (ref >59)

## 2021-04-22 LAB — THYROID PANEL WITH TSH
Free Thyroxine Index: 1.9 (ref 1.2–4.9)
T3 Uptake Ratio: 25 % (ref 24–39)
T4, Total: 7.6 ug/dL (ref 4.5–12.0)
TSH: 1.13 u[IU]/mL (ref 0.450–4.500)

## 2021-04-22 LAB — LIPID PANEL
Chol/HDL Ratio: 3.4 ratio (ref 0.0–5.0)
Cholesterol, Total: 178 mg/dL (ref 100–199)
HDL: 53 mg/dL (ref >39)
LDL Chol Calc (NIH): 114 mg/dL — ABNORMAL HIGH (ref 0–99)
Triglycerides: 59 mg/dL (ref 0–149)
VLDL Cholesterol Cal: 11 mg/dL (ref 5–40)

## 2021-04-22 LAB — HEMOGLOBIN A1C
Est. average glucose Bld gHb Est-mCnc: 123 mg/dL
Hgb A1c MFr Bld: 5.9 % — ABNORMAL HIGH (ref 4.8–5.6)

## 2021-04-28 ENCOUNTER — Encounter (HOSPITAL_BASED_OUTPATIENT_CLINIC_OR_DEPARTMENT_OTHER): Payer: Self-pay | Admitting: Family Medicine

## 2021-04-28 ENCOUNTER — Ambulatory Visit (INDEPENDENT_AMBULATORY_CARE_PROVIDER_SITE_OTHER): Payer: No Typology Code available for payment source | Admitting: Family Medicine

## 2021-04-28 VITALS — BP 119/59 | HR 78 | Temp 97.7°F | Ht 66.0 in | Wt 140.3 lb

## 2021-04-28 DIAGNOSIS — M5136 Other intervertebral disc degeneration, lumbar region: Secondary | ICD-10-CM | POA: Insufficient documentation

## 2021-04-28 DIAGNOSIS — R7303 Prediabetes: Secondary | ICD-10-CM | POA: Diagnosis not present

## 2021-04-28 DIAGNOSIS — M47816 Spondylosis without myelopathy or radiculopathy, lumbar region: Secondary | ICD-10-CM | POA: Insufficient documentation

## 2021-04-28 DIAGNOSIS — Z0000 Encounter for general adult medical examination without abnormal findings: Secondary | ICD-10-CM | POA: Diagnosis not present

## 2021-04-28 NOTE — Patient Instructions (Signed)
Preventive Care 21-37 Years Old, Male Preventive care refers to lifestyle choices and visits with your health care provider that can promote health and wellness. Preventive care visits are also called wellness exams. What can I expect for my preventive care visit? Counseling During your preventive care visit, your health care provider may ask about your: Medical history, including: Past medical problems. Family medical history. Current health, including: Emotional well-being. Home life and relationship well-being. Sexual activity. Lifestyle, including: Alcohol, nicotine or tobacco, and drug use. Access to firearms. Diet, exercise, and sleep habits. Safety issues such as seatbelt and bike helmet use. Sunscreen use. Work and work environment. Physical exam Your health care provider may check your: Height and weight. These may be used to calculate your BMI (body mass index). BMI is a measurement that tells if you are at a healthy weight. Waist circumference. This measures the distance around your waistline. This measurement also tells if you are at a healthy weight and may help predict your risk of certain diseases, such as type 2 diabetes and high blood pressure. Heart rate and blood pressure. Body temperature.  Vaccines are usually given at various ages, according to a schedule. Your health care provider will recommend vaccines for you based on your age, medical history, and lifestyle or other factors, such as travel or where you work. What tests do I need? Screening Your health care provider may recommend screening tests for certain conditions. This may include: Lipid and cholesterol levels. Diabetes screening. This is done by checking your blood sugar (glucose) after you have not eaten for a while (fasting). Hepatitis B test. Hepatitis C test. HIV (human immunodeficiency virus) test. STI (sexually transmitted infection) testing, if you are at risk. Talk with your health care  provider about your test results, treatment options, and if necessary, the need for more tests. Follow these instructions at home: Eating and drinking  Eat a healthy diet that includes fresh fruits and vegetables, whole grains, lean protein, and low-fat dairy products. Drink enough fluid to keep your urine pale yellow. Take vitamin and mineral supplements as recommended by your health care provider. Do not drink alcohol if your health care provider tells you not to drink. If you drink alcohol: Limit how much you have to 0-2 drinks a day. Know how much alcohol is in your drink. In the [place] one drink equals one 12 oz bottle of beer (355 mL), one 5 oz glass of wine (148 mL), or one 1 oz glass of hard liquor (44 mL). Lifestyle Brush your teeth every morning and night with fluoride toothpaste. Floss one time each day. Exercise for at least 30 minutes 5 or more days each week. Do not use any products that contain nicotine or tobacco. These products include cigarettes, chewing tobacco, and vaping devices, such as e-cigarettes. If you need help quitting, ask your health care provider. Do not use drugs. If you are sexually active, practice safe sex. Use a condom or other form of protection to prevent STIs. Find healthy ways to manage stress, such as: Meditation, yoga, or listening to music. Journaling. Talking to a trusted person. Spending time with friends and family. Minimize exposure to UV radiation to reduce your risk of skin cancer. Safety Always wear your seat belt while driving or riding in a vehicle. Do not drive: If you have been drinking alcohol. Do not ride with someone who has been drinking. If you have been using any mind-altering substances or drugs. While texting. When you are tired or   distracted. Wear a helmet and other protective equipment during sports activities. If you have firearms in your house, make sure you follow all gun safety procedures. Seek help if you have been  physically or sexually abused. What's next? Go to your health care provider once a year for an annual wellness visit. Ask your health care provider how often you should have your eyes and teeth checked. Stay up to date on all vaccines. This information is not intended to replace advice given to you by your health care provider. Make sure you discuss any questions you have with your health care provider. Document Revised: 06/26/2020 Document Reviewed: 06/26/2020 Elsevier Patient Education  2023 Elsevier Inc.  

## 2021-04-28 NOTE — Assessment & Plan Note (Signed)
Routine HCM labs reviewed. HCM reviewed/discussed. Anticipatory guidance regarding healthy weight, lifestyle and choices given. ?Recommend healthy diet.  Recommend approximately 150 minutes/week of moderate intensity exercise ?Recommend regular dental and vision exams ?Always use seatbelt/lap and shoulder restraints ?Recommend using smoke alarms and checking batteries at least twice a year ?Recommend using sunscreen when outside ?Patient is up-to-date on tetanus booster ?

## 2021-04-28 NOTE — Progress Notes (Signed)
?Subjective:   ? ?CC: Annual Physical Exam ? ?HPI:  ?Justin Morrow is a 36 y.o. presenting for annual physical ? ?I reviewed the past medical history, family history, social history, surgical history, and allergies today and no changes were needed.  Please see the problem list section below in epic for further details. ? ?Past Medical History: ?History reviewed. No pertinent past medical history. ?Past Surgical History: ?Past Surgical History:  ?Procedure Laterality Date  ? KNEE ARTHROSCOPY  1999  ? SPINE SURGERY  12/29/2005  ? ?Social History: ?Social History  ? ?Socioeconomic History  ? Marital status: Married  ?  Spouse name: Not on file  ? Number of children: Not on file  ? Years of education: Not on file  ? Highest education level: Not on file  ?Occupational History  ? Not on file  ?Tobacco Use  ? Smoking status: Never  ? Smokeless tobacco: Never  ?Vaping Use  ? Vaping Use: Never used  ?Substance and Sexual Activity  ? Alcohol use: Yes  ?  Alcohol/week: 1.0 standard drink  ?  Types: 1 Glasses of wine per week  ?  Comment: rarely, every 2-3 months  ? Drug use: Never  ? Sexual activity: Yes  ?Other Topics Concern  ? Not on file  ?Social History Narrative  ? Not on file  ? ?Social Determinants of Health  ? ?Financial Resource Strain: Not on file  ?Food Insecurity: Not on file  ?Transportation Needs: Not on file  ?Physical Activity: Not on file  ?Stress: Not on file  ?Social Connections: Not on file  ? ?Family History: ?Family History  ?Problem Relation Age of Onset  ? Cancer Mother   ? Hyperlipidemia Father   ? Hypertension Father   ? Healthy Sister   ? Healthy Brother   ? Dementia Maternal Grandmother   ? Alzheimer's disease Maternal Grandmother   ? Cancer Maternal Grandfather   ? Diabetes Paternal Grandmother   ? Heart disease Paternal Grandfather   ? Colon cancer Neg Hx   ? Stomach cancer Neg Hx   ? Pancreatic cancer Neg Hx   ? Esophageal cancer Neg Hx   ? ?Allergies: ?No Known Allergies ?Medications: See  med rec. ? ?Review of Systems: No headache, visual changes, nausea, vomiting, diarrhea, constipation, dizziness, abdominal pain, skin rash, fevers, chills, night sweats, swollen lymph nodes, weight loss, chest pain, body aches, joint swelling, muscle aches, shortness of breath, mood changes, visual or auditory hallucinations. ? ?Objective:   ? ?BP (!) 119/59   Pulse 78   Temp 97.7 ?F (36.5 ?C)   Ht '5\' 6"'$  (1.676 m)   Wt 140 lb 4.8 oz (63.6 kg)   SpO2 100%   BMI 22.65 kg/m?  ? ?General: Well Developed, well nourished, and in no acute distress.  ?Neuro: Alert and oriented x3, extra-ocular muscles intact, sensation grossly intact. Cranial nerves II through XII are intact, motor, sensory, and coordinative functions are all intact. ?HEENT: Normocephalic, atraumatic, pupils equal round reactive to light, neck supple, no masses, no lymphadenopathy, thyroid nonpalpable. Oropharynx, nasopharynx, external ear canals are unremarkable. ?Skin: Warm and dry, no rashes noted.  ?Cardiac: Regular rate and rhythm, no murmurs rubs or gallops.  ?Respiratory: Clear to auscultation bilaterally. Not using accessory muscles, speaking in full sentences.  ?Abdominal: Soft, nontender, nondistended, positive bowel sounds, no masses, no organomegaly.  ?Musculoskeletal: Shoulder, elbow, wrist, hip, knee, ankle stable, and with full range of motion. ? ?Impression and Recommendations:   ? ?Prediabetes ?Long discussion with patient  today regarding elevated hemoglobin A1c.  He has been working on lifestyle modifications, however did have slight increase in A1c from prior check, increased from 5.7% to 5.9% ?Currently, patient is following a vegetarian diet, and discussion, it does seem that since he is doing a vegetarian diet, does have moderate carbohydrate consumption, discussed being mindful of this ?He has been exercising regularly, primarily with walking, will achieve heart rate that is in the 130-160 range ?Discussed possibility of  increasing intensity of his exercise, particularly related to incorporating hills and inclines into his walking ?He is interested in meeting with nutritionist, will place referral ?Continue to monitor A1c, plan for follow-up in about 6 months to recheck this ? ?Wellness examination ?Routine HCM labs reviewed. HCM reviewed/discussed. Anticipatory guidance regarding healthy weight, lifestyle and choices given. ?Recommend healthy diet.  Recommend approximately 150 minutes/week of moderate intensity exercise ?Recommend regular dental and vision exams ?Always use seatbelt/lap and shoulder restraints ?Recommend using smoke alarms and checking batteries at least twice a year ?Recommend using sunscreen when outside ?Patient is up-to-date on tetanus booster ? ?Plan for follow-up in about 6 months to monitor progress with prediabetes, will recheck hemoglobin A1c at that time ? ? ?___________________________________________ ?Vyncent Overby de [place], MD, ABFM, CAQSM ?Primary Care and Sports Medicine ?[place] ?

## 2021-04-28 NOTE — Assessment & Plan Note (Signed)
Long discussion with patient today regarding elevated hemoglobin A1c.  He has been working on lifestyle modifications, however did have slight increase in A1c from prior check, increased from 5.7% to 5.9% ?Currently, patient is following a vegetarian diet, and discussion, it does seem that since he is doing a vegetarian diet, does have moderate carbohydrate consumption, discussed being mindful of this ?He has been exercising regularly, primarily with walking, will achieve heart rate that is in the 130-160 range ?Discussed possibility of increasing intensity of his exercise, particularly related to incorporating hills and inclines into his walking ?He is interested in meeting with nutritionist, will place referral ?Continue to monitor A1c, plan for follow-up in about 6 months to recheck this ?

## 2021-05-21 ENCOUNTER — Encounter: Payer: Self-pay | Admitting: Registered"

## 2021-05-21 ENCOUNTER — Encounter: Payer: No Typology Code available for payment source | Attending: Family Medicine | Admitting: Registered"

## 2021-05-21 DIAGNOSIS — R7303 Prediabetes: Secondary | ICD-10-CM | POA: Insufficient documentation

## 2021-05-21 DIAGNOSIS — Z713 Dietary counseling and surveillance: Secondary | ICD-10-CM | POA: Insufficient documentation

## 2021-05-21 DIAGNOSIS — Z6821 Body mass index (BMI) 21.0-21.9, adult: Secondary | ICD-10-CM | POA: Diagnosis not present

## 2021-05-21 NOTE — Patient Instructions (Addendum)
Plan ?Include a variety of protein in your diet and include meat for now ?Use the plate method for amount of carbohydrates to include ?For rice and pasta 1 cup is about 45 g and may be the right about to eat along with protein and vegetables. ?Continue to include yogurt and nuts as sources of proteins ?Consider testing your blood sugar 3 times total before your next appointment: ?after 2 sample meals - goal is less than 165 two hours after you start the meal. Either write down what you ate or take a picture to bring to next appointment ?Check once within 15-20 min after exercise ?Stay hydrated ? ?Following the American Diabetes Plate method guidelines would serve 2 purposes:  ?Get more balance and calories back into diet to prevent weight loss and gain a few pounds back. ?To gather more data on your current blood sugar control since cutting back sweets and follow the guidelines for controlling blood sugar with adding more variety of food groups in your diet. ? ?At next appointment we can discuss whether or not working toward a vegetarian diet is right for you ?

## 2021-05-21 NOTE — Progress Notes (Signed)
Medical Nutrition Therapy  ?Appointment Start time:  7829  Appointment End time:  5621 ? ?Primary concerns today: Pt states he requested this appt because he is unsure if he is getting nutrition he needs since making dietary changes to a more plant-based diet. Also wondering if he should be concerned about the quick weight loss he has experienced. ? ?Although the referral is for pre-diabetes, Pt states he is not too concerned because feels he can explain the increased A1c by the birthday celebrations last 3 months and eating a lot of sweets. ? ?Referral diagnosis: R73.03 (ICD-10-CM) - Prediabetes ? ?Preferred learning style: no preference indicated ?Learning readiness: contemplating, ready ? ? ?NUTRITION ASSESSMENT  ?Anthropometrics  ?Wt Readings from Last 3 Encounters:  ?05/21/21 133 lb (60.3 kg)  ?04/28/21 140 lb 4.8 oz (63.6 kg)  ?10/25/20 143 lb (64.9 kg)  ? ?Clinical ?Medical Hx: kidney stones ?Family Hx: uncle with T1D, another relative T2D ?Medications: none ?Labs: 5.9%, 1 yr ago 5.7% ?Notable Signs/Symptoms: 5% weight loss in less than 1 month ? ?Lifestyle & Dietary Hx ?Pt reports UBW 156 lb last year and felt he should lose some weight. After changing to a plant-based diet reports fast weight loss, increased energy and felt better in his clothes. Pt states his wife is concerned that he is missing essential nutrients with minimal meat intake.  ? ?Pt reports 140 lbs was a good weight for him. Pt states recent unintentional 7 lbs weight loss could be due to stress from wife's recent pregnancy complications and not eating well. ? ?Patient weight loss of ~5% over one month is one of the criteria for moderate malnutrition and further evaluation would be needed to make a conclusive nutritional diagnosis. May not be of concern if weight can be regained with getting enough nutrition. ? ?Patient was encouraged to increase intake and avoid restricting categories of food at this time. After weight is stabilized and we  look at a few blood sugar readings.If he desires to transition to a vegetarian or mostly plant-based diet patient he will continue nutrition counseling with Antonieta Iba who has expertise in meeting nutritional needs in context of vegetarian diet and blood sugar control. ? ?Pt continued weight loss combined with increased A1c and family history of T1D prompted discussion with patient about Latent autoimmune diabetes in adults (LADA) which is a slow-progressing form of autoimmune diabetes similar to T1D. If we can stabilize weight and blood sugar with dietary changes we likely would not need to investigate LADA further and could assume it is pre-diabetes. ? ?Physical activity: Pt reports he has always ben a runner, but stopped when having back issues but was able to avoid back surgery. Pt reports now walking at a fast past to raise heart rate ? ?Estimated daily fluid intake: not assessed oz ?Supplements: not assessed ?Sleep: not assessed ?Stress / self-care: not assessed ?Current average weekly physical activity: Consistent since Jan has been walking 35-40 min in morning getting into zone 3-5.  ? ?24-Hr Dietary Recall ?First Meal: oatmeal, apples, almond milk OR avocado toast ?Snack: nuts ?Second Meal: left overs ?Snack:  ?Third Meal: yogurt, salad kale or spinach, berries,  ?Snack:  ?Beverages: water, 8 oz black coffee in the morning ? ?Estimated Energy Needs ?Calories: 2300 to maintain weight +250-500 cal to gain weight ? ? ?NUTRITION DIAGNOSIS  ?NB-1.1 Food and nutrition-related knowledge deficit As related to balanced eating to maintain weight and get daily nutrients needed for health.  As evidenced by Pt states reason  for requesting appointment. ? ? ?NUTRITION INTERVENTION  ?Nutrition education (E-1) on the following topics:  ?Plate method for good nutrition and managing blood sugar ?A1c and corresponding POC blood sugar values ?Goals for blood sugar fasting and 2 hours after meals ?How to test blood  glucose ? ?Accu-chek Guide Me ?Lot #[id number] ?Exp: 04/30/2022 ?CBG: 99 mg/dL (oatmeal breakfast was 4 hours ago) ? ?Handouts Provided Include  ?A1c chart ?MyPlate Planner ? ?Learning Style & Readiness for Change ?Teaching method utilized: Visual & Auditory  ?Demonstrated degree of understanding via: Teach Back  ?Barriers to learning/adherence to lifestyle change: none ? ?Goals Established by Pt ?Include a variety of protein in your diet and include meat for now ?Use the plate method for amount of carbohydrates to include ?For rice and pasta 1 cup is about 45 g and may be the right about to eat along with protein and vegetables. ?Continue to include yogurt and nuts as sources of proteins ?Consider testing your blood sugar 3 times total before your next appointment: ?after 2 sample meals - goal is less than 165 two hours after you start the meal. Either write down what you ate or take a picture to bring to next appointment ?Check once within 15-20 min after exercise ?Stay hydrated ? ?Following the American Diabetes Plate method guidelines would serve 2 purposes:  ?1) Get more balance and calories back into diet to prevent weight loss and gain a few pounds back. ?2) To gather more data on your current blood sugar control since cutting back sweets and follow the guidelines for controlling blood sugar with adding more variety of food groups in your diet. ? ?At next appointment we can discuss whether or not working toward a vegetarian diet is right for you ? ?MONITORING & EVALUATION ?Dietary intake, weekly physical activity, and weight in 4-6 weeks. ?

## 2021-05-22 IMAGING — DX DG LUMBAR SPINE COMPLETE 4+V
5 series · 5 of 5 positions shown · non-contrast
Comparison: None.

CLINICAL DATA: Low back pain.  History of prior surgery.

EXAM:
LUMBAR SPINE - COMPLETE 4+ VIEW

[l-spine ap]
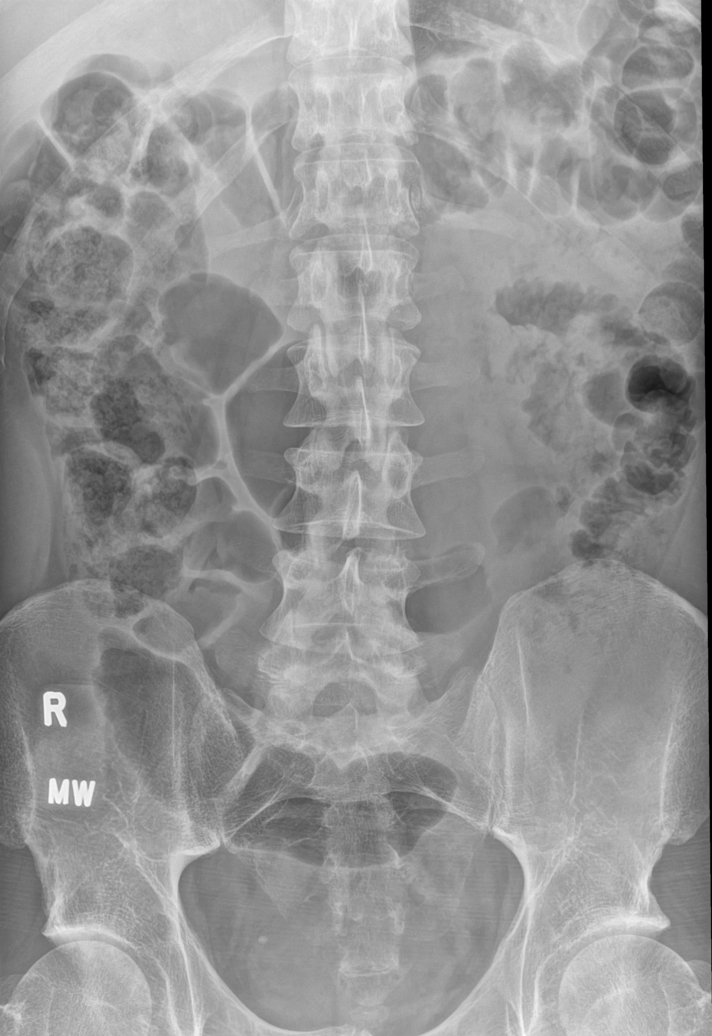

[l-spine obl (1 of 2)]
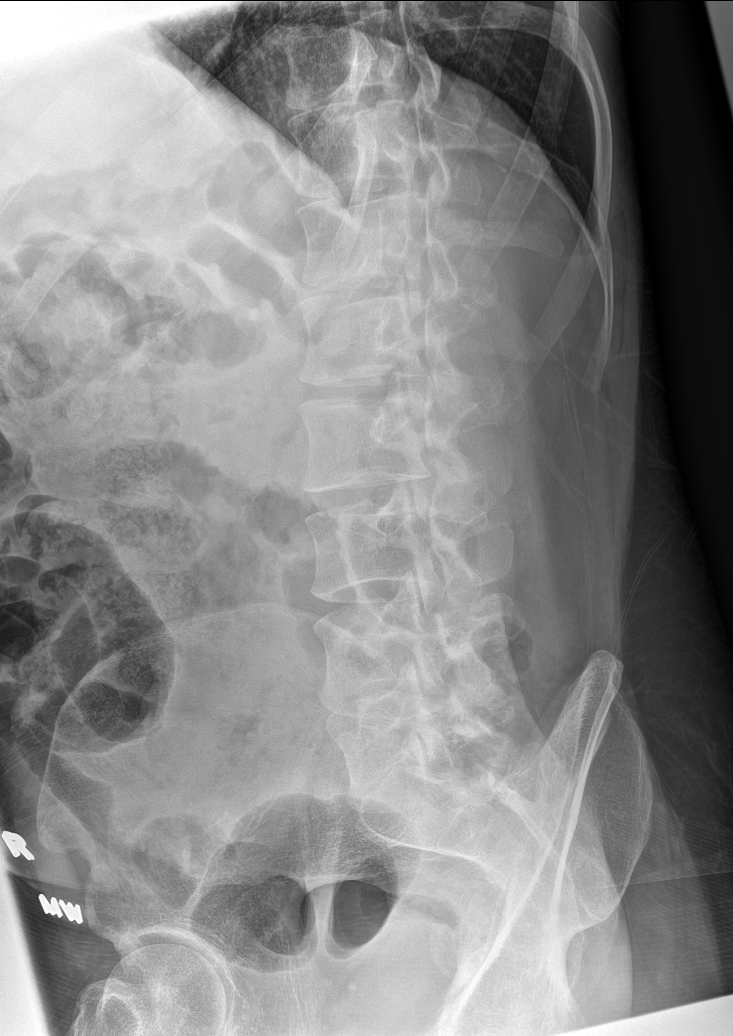

[l-spine obl (2 of 2)]
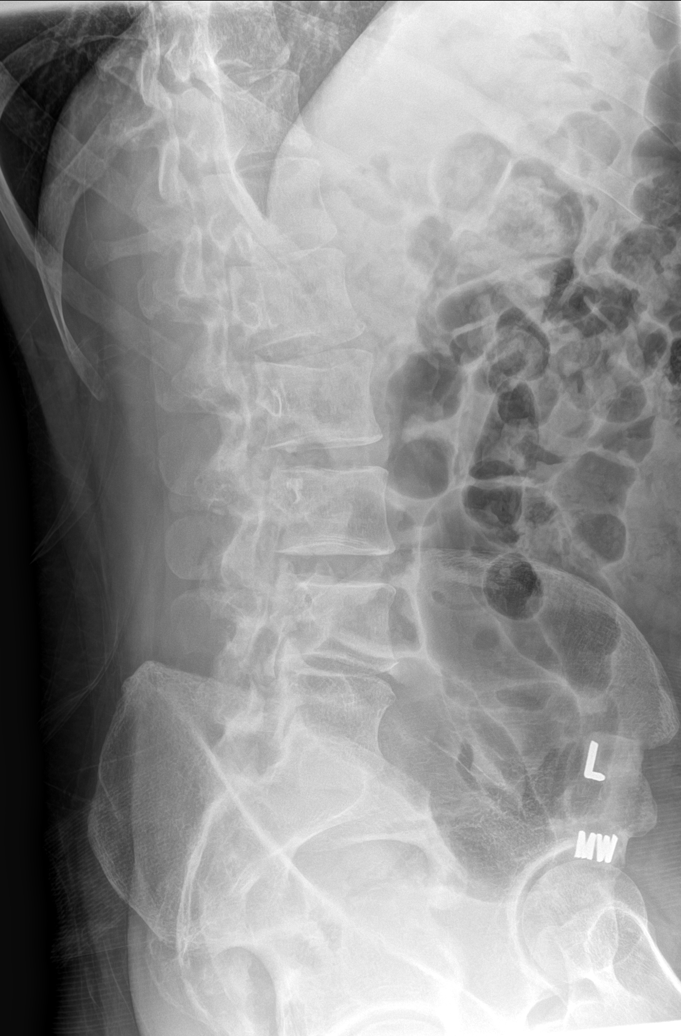

[l-spine lat (1 of 2)]
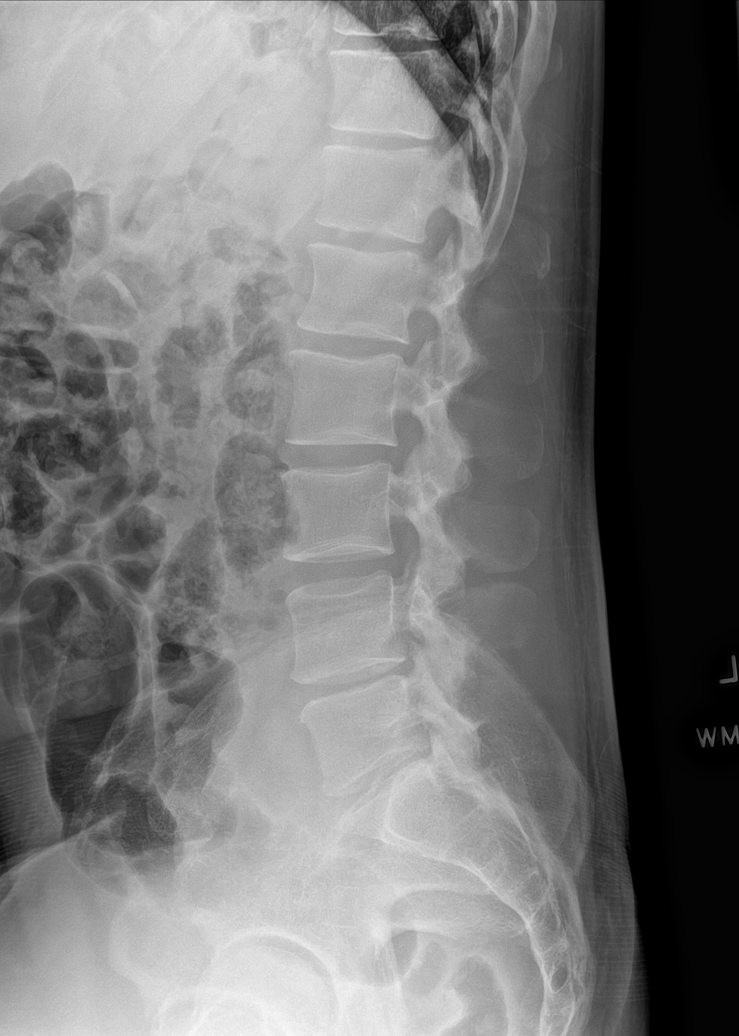

[l-spine lat (2 of 2)]
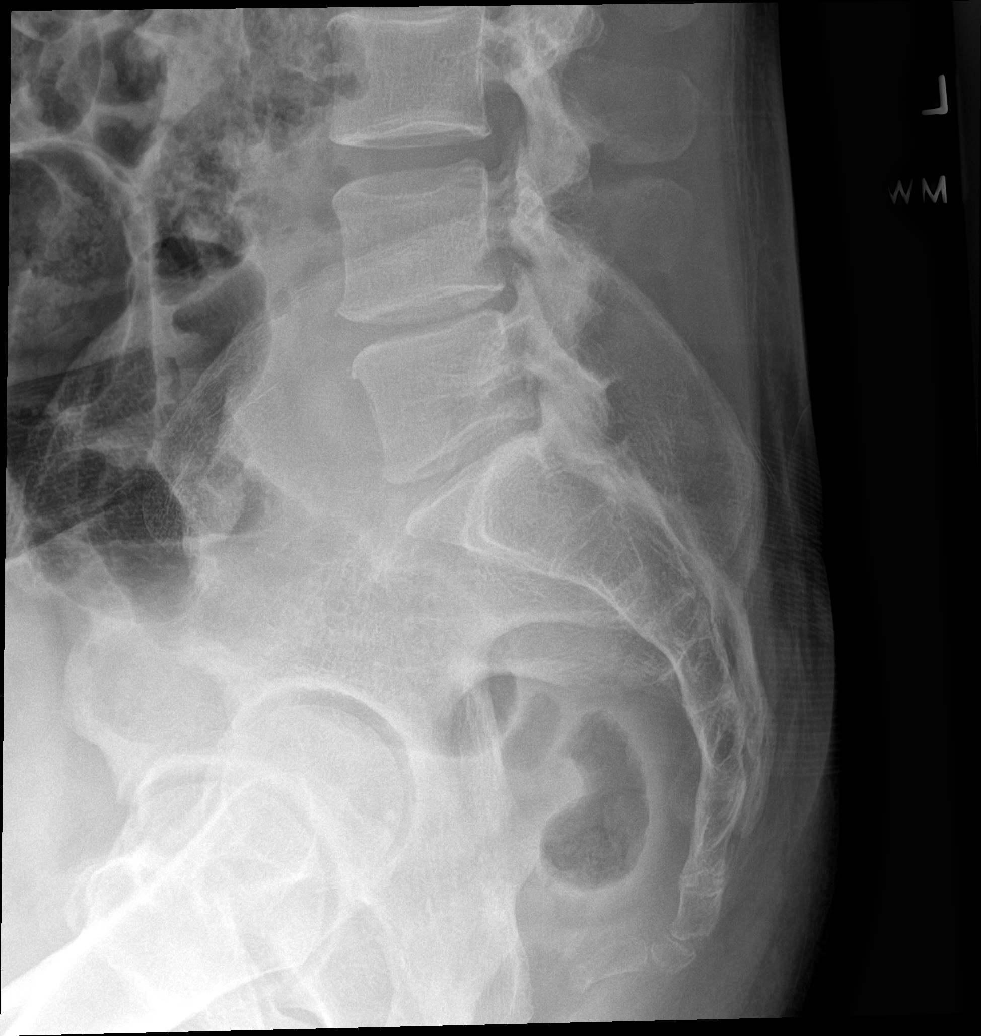

[5 of 5 positions shown; findings below may reference images not displayed]

FINDINGS: Normal alignment on the lateral film.

Evidence of prior lumbar surgery with resection of the spinous
process of L5. There is mild disc space narrowing at L4-5 and L5-S1.

The facets are normally aligned.  No pars defects.

The bony pelvis is intact. The SI joints appear normal. Both hips
are normally located.
IMPRESSION: 1. Mild disc space narrowing at L4-5 and L5-S1.
2. Normal alignment and no acute bony findings.

## 2021-06-27 ENCOUNTER — Ambulatory Visit: Payer: No Typology Code available for payment source | Admitting: Registered"

## 2021-07-10 ENCOUNTER — Encounter: Payer: No Typology Code available for payment source | Attending: Family Medicine | Admitting: Dietician

## 2021-07-10 DIAGNOSIS — R7303 Prediabetes: Secondary | ICD-10-CM | POA: Insufficient documentation

## 2021-07-10 NOTE — Progress Notes (Signed)
Medical Nutrition Therapy  Appointment Start time:  [phone]  Appointment End time:  1497 Patient is here today alone.  He was last seen by an RD in our office on 05/21/2021.  Primary concerns today:  Decided 1 year ago to be more vegetarian based and started using the meat alternatives.  He wished to eat healthier and maintain a healthy weight.  He is very active. He also is concerned about his increased A1C in the prediabetes range. He was given a blood glucose meter at initial appointment and is not currently checking his blood sugar.  Referral diagnosis: R73.03 (ICD-10-CM) - Prediabetes  Preferred learning style: no preference indicated Learning readiness: contemplating, ready   NUTRITION ASSESSMENT  Anthropometrics  66.5" 139 lbs 07/10/2021 133 lbs 05/21/2021 150-160 lbs UBW 140-145 lbs preferred weight Pt reports 140 lbs was a good weight for him. Pt states recent unintentional 7 lbs weight loss could be due to stress from wife's recent pregnancy complications and not eating well.  Clinical Medical Hx: kidney stones Family Hx: uncle with T1D, another relative T2D Medications: none Labs: 5.9%, 1 yr ago 5.7% Cholesterol decreased from 200 to 178, LDL decreased from 129 to 114 in the past year Notable Signs/Symptoms: 5% weight loss in less than 1 month and now regaining  Lifestyle & Dietary Hx Patient lives with his wife and 2 children.  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 Gradual increase of more plant based eating with some use of meat substitutes.  Estimated daily fluid intake: not assessed  Supplements: not assessed Sleep: not assessed Stress / self-care: not assessed Current average weekly physical activity: Consistent since Jan has been walking 35-40 min in morning getting into zone 3-5.  Physical activity: Pt reports he has always been a runner, but stopped when having back issues but was able to avoid back surgery. Pt reports now walking at a fast past to raise heart  rate.  24-Hr Dietary Recall First Meal: oatmeal, apples, almond milk, almond butter OR smoothie with high protein yogurt, 1/4 cup dry oats, almond butter, fruit, almond milk OR avocado toast Snack: nuts Second Meal: high protein yogurt, fruit, salad OR left overs Snack:  Third Meal: shepard's pie OR salmon, vegetable and fruit OR tofu spring rolls with vegetables, salad OR lentil sloppy joe on bun, vegetable Snack:  Beverages: water, 8 oz black coffee in the morning  Estimated Energy Needs Calories: 2300 to maintain weight +250-500 cal to gain weight, 80 grams protein  NUTRITION DIAGNOSIS  NB-1.1 Food and nutrition-related knowledge deficit As related to balanced eating to maintain weight and get daily nutrients needed for health.  As evidenced by Pt states reason for requesting appointment.   NUTRITION INTERVENTION  Nutrition education (E-1) on the following topics:  Dscussion with patient about Latent autoimmune diabetes in adults (LADA) which is a slow-progressing form of autoimmune diabetes similar to T1D. If we can stabilize weight and blood sugar with dietary changes we likely would not need to investigate LADA further and could assume it is pre-diabetes.  MD to monitor A1C trend. Patient to call if increased thirst and increased urination or if he begins checking his blood glucose and notes elevated readings. Discussion regarding plant based eating and improvement of insulin sensitivity.  Discussion of other lifestyle factors such as exercise and decreasing saturated fat that also can improve insulin sensitivity. Plant based eating research Protein needs and estimation that current intake is now adequate Need for vitamin B-12 on a plant based diet as well  as other supplement considerations Whole foods plant based verses adding some meat substitutes.  Discussed that both can improve health parameters Resources for recipes and nutrition discussion regarding plant based  eating Importance of adequate calorie and protein intake  Handouts Provided Include  Plant based primer from [employer] Plant based meal ideas for those with diabetes Plant based resources (book list) Vegetarian proteins  Learning Style & Readiness for Change Teaching method utilized: Visual & Auditory  Demonstrated degree of understanding via: Teach Back  Barriers to learning/adherence to lifestyle change: none  Goals Established by Pt Work toward gradual plant based eating Eat adequate amounts for weight gain/maintenance Stay active Continue meal prep Include a protein choice with each meal   MONITORING & EVALUATION Dietary intake, weekly physical activity, and weight in 4-6 weeks.

## 2021-07-11 NOTE — Patient Instructions (Addendum)
Work toward gradual plant based eating Eat adequate amounts for weight gain/maintenance Stay active Continue meal prep Include a protein choice with each meal

## 2021-10-28 ENCOUNTER — Ambulatory Visit (HOSPITAL_BASED_OUTPATIENT_CLINIC_OR_DEPARTMENT_OTHER): Payer: No Typology Code available for payment source | Admitting: Family Medicine

## 2021-11-11 ENCOUNTER — Encounter (HOSPITAL_BASED_OUTPATIENT_CLINIC_OR_DEPARTMENT_OTHER): Payer: Self-pay | Admitting: Physical Therapy

## 2021-11-11 ENCOUNTER — Encounter (HOSPITAL_BASED_OUTPATIENT_CLINIC_OR_DEPARTMENT_OTHER): Payer: Self-pay | Admitting: Family Medicine

## 2021-11-11 DIAGNOSIS — M79606 Pain in leg, unspecified: Secondary | ICD-10-CM

## 2021-12-16 ENCOUNTER — Ambulatory Visit (HOSPITAL_BASED_OUTPATIENT_CLINIC_OR_DEPARTMENT_OTHER): Payer: No Typology Code available for payment source | Admitting: Physical Therapy

## 2021-12-18 ENCOUNTER — Encounter (HOSPITAL_BASED_OUTPATIENT_CLINIC_OR_DEPARTMENT_OTHER): Payer: Self-pay | Admitting: Physical Therapy

## 2021-12-18 ENCOUNTER — Other Ambulatory Visit: Payer: Self-pay

## 2021-12-18 ENCOUNTER — Ambulatory Visit (HOSPITAL_BASED_OUTPATIENT_CLINIC_OR_DEPARTMENT_OTHER): Payer: No Typology Code available for payment source | Attending: Family Medicine | Admitting: Physical Therapy

## 2021-12-18 DIAGNOSIS — R262 Difficulty in walking, not elsewhere classified: Secondary | ICD-10-CM | POA: Diagnosis present

## 2021-12-18 DIAGNOSIS — M79606 Pain in leg, unspecified: Secondary | ICD-10-CM | POA: Insufficient documentation

## 2021-12-18 DIAGNOSIS — M25551 Pain in right hip: Secondary | ICD-10-CM | POA: Insufficient documentation

## 2021-12-18 NOTE — Therapy (Signed)
OUTPATIENT PHYSICAL THERAPY THORACOLUMBAR EVALUATION   Patient Name: Justin Morrow MRN: [id number] DOB:1985/07/15, 36 y.o., male Today's Date: 12/18/2021  END OF SESSION:  PT End of Session - 12/18/21 1730     Visit Number 1    Number of Visits 5    Date for PT Re-Evaluation 01/30/22    Authorization Type [facility] focus    PT Start Time 1650    PT Stop Time 1720    PT Time Calculation (min) 30 min    Activity Tolerance Patient tolerated treatment well    Behavior During Therapy [facility] for tasks assessed/performed             History reviewed. No pertinent past medical history. Past Surgical History:  Procedure Laterality Date   KNEE ARTHROSCOPY  1999   SPINE SURGERY  12/29/2005   Patient Active Problem List   Diagnosis Date Noted   Degeneration of lumbar intervertebral disc 04/28/2021   Lumbar spondylosis 04/28/2021   Wellness examination 04/28/2021   Gross hematuria 10/25/2020   Ischial bursitis of left side 10/08/2020   Spinal stenosis at L4-L5 level 05/29/2020   Lumbosacral radiculopathy 05/23/2020   Low back pain 04/24/2020   Hip pain 04/24/2020   Strain of calf muscle 04/24/2020   Prediabetes 04/24/2020   Hyperlipidemia 04/24/2020   Transaminitis 04/24/2020    REFERRING PROVIDER: Burnard Bunting, MD  REFERRING DIAG:  M79.606 (ICD-10-CM) - Pain of lower extremity, unspecified laterality     Rationale for Evaluation and Treatment: Rehabilitation  THERAPY DIAG:  Pain in right hip  ONSET DATE: a few weeks ago  SUBJECTIVE:                                                                                                                                                                                           SUBJECTIVE STATEMENT: Went for a light jog. I haven't stretched for a long time. I felt a slight pull in HS and gave it some time off. It felt fine but now any time I do a stretch it seems to re-aggrivate the injury. It seems more light tightness in glut/piriformis  that goes down to HS. Right now just sticking to walking.   PERTINENT HISTORY:  H/o back pain  PAIN:  Are you having pain? Yes: NPRS scale: n/a/10 Pain location: rt buttock and HS Pain description: tight Aggravating factors: standing still, bending/stretching Relieving factors: moving  PRECAUTIONS: None  WEIGHT BEARING RESTRICTIONS: No  FALLS:  Has patient fallen in last 6 months? No  OCCUPATION: psyD  PLOF: Independent  PATIENT GOALS: decrease pain, back to activity    OBJECTIVE:   POSTURE:  Anterior  lean of the body and standing when viewing laterally  PALPATION: Tightness limiting Rt LE ADD Tightness noted in bil HS and piriformis with tenderness to Rt side  LUMBAR ROM:   AROM eval  Flexion   Extension   Right lateral flexion   Left lateral flexion   Right rotation   Left rotation    (Blank rows = not tested)  LOWER EXTREMITY ROM:     Within functional limits  LOWER EXTREMITY MMT:    Gross 5 out of 5 hip abduction with discomfort on the right side GAIT: Distance walked: Ambulated within clinic Assistive device utilized: None Level of assistance: Complete Independence Comments: Right foot strike lateral to medial versus heel-to-toe on the left side  TODAY'S TREATMENT:                                                                                                                               Treatment                            12/18/21  Trigger Point Dry Needling, Manual Therapy Treatment:  Initial or subsequent education regarding Trigger Point Dry Needling: Subsequent Did patient give consent to treatment with Trigger Point Dry Needling: Yes TPDN with skilled palpation and monitoring followed by STM to the following muscles: Rt HS and piriformis   Seated hamstring stretch Seated piriformis stretch Standing slant board stretch   PATIENT EDUCATION:  Education details: Anatomy of condition, POC, HEP, exercise form/rationale Person educated:  Patient Education method: Explanation, Demonstration, Tactile cues, Verbal cues, and Handouts Education comprehension: verbalized understanding, returned demonstration, verbal cues required, tactile cues required, and needs further education  HOME EXERCISE PROGRAM: Twice daily: Seated hamstring stretch seated piriformis stretch standing gastroc stretch  ASSESSMENT:  CLINICAL IMPRESSION: Patient is a 36 y.o. M who was seen today for physical therapy evaluation and treatment for tightness in right piriformis and hamstrings as a result of a run.  Patient notes that he has not been good about stretching regularly and admits that he understands he will need to do so moving forward.  We will follow-up again in a week for further manual therapy and recheck running form.  OBJECTIVE IMPAIRMENTS: Abnormal gait, decreased activity tolerance, impaired flexibility, improper body mechanics, and pain.   ACTIVITY LIMITATIONS: standing, squatting, and runnin  PARTICIPATION LIMITATIONS: community activity  PERSONAL FACTORS: 1 comorbidity: h/o back pain  are also affecting patient's functional outcome.   REHAB POTENTIAL: Good  CLINICAL DECISION MAKING: Stable/uncomplicated  EVALUATION COMPLEXITY: Low   GOALS: Goals reviewed with patient? Yes  LONG TERM GOALS: Target date: 01/30/22  Pt will demo proper form in long term stretching program Baseline:  Goal status: INITIAL  2.  Able to jog without distal symptoms Baseline:  Goal status: INITIAL  3.  Resolution of HS pain and excessive tightness Baseline:  Goal status: INITIAL    PLAN:  PT  FREQUENCY: 1x/week  PT DURATION: 4 weeks  PLANNED INTERVENTIONS: Therapeutic exercises, Therapeutic activity, Neuromuscular re-education, Balance training, Gait training, Patient/Family education, Self Care, Joint mobilization, Joint manipulation, Stair training, Aquatic Therapy, Dry Needling, Electrical stimulation, Spinal mobilization, Cryotherapy,  Moist heat, Taping, Ionotophoresis '4mg'$ /ml Dexamethasone, and Manual therapy.  PLAN FOR NEXT SESSION: DN PRN, hip abd strength progressions   Martinez Boxx C. Genever Hentges PT, DPT 12/18/21 5:42 PM

## 2022-01-02 ENCOUNTER — Telehealth: Payer: No Typology Code available for payment source | Admitting: Family Medicine

## 2022-01-02 DIAGNOSIS — H6692 Otitis media, unspecified, left ear: Secondary | ICD-10-CM

## 2022-01-02 MED ORDER — CIPROFLOXACIN-DEXAMETHASONE 0.3-0.1 % OT SUSP: 4.0000 [drp] | Freq: Two times a day (BID) | OTIC | 0 refills | Status: AC

## 2022-01-02 MED ORDER — AMOXICILLIN-POT CLAVULANATE 875-125 MG PO TABS: 1.0000 | ORAL_TABLET | Freq: Two times a day (BID) | ORAL | 0 refills | Status: DC

## 2022-01-02 NOTE — Patient Instructions (Signed)

## 2022-01-02 NOTE — Progress Notes (Signed)
Virtual Visit Consent   Justin Morrow, you are scheduled for a virtual visit with a [facility] provider today. Just as with appointments in the office, your consent must be obtained to participate. Your consent will be active for this visit and any virtual visit you may have with one of our providers in the next 365 days. If you have a MyChart account, a copy of this consent can be sent to you electronically.  As this is a virtual visit, video technology does not allow for your provider to perform a traditional examination. This may limit your provider's ability to fully assess your condition. If your provider identifies any concerns that need to be evaluated in person or the need to arrange testing (such as labs, EKG, etc.), we will make arrangements to do so. Although advances in technology are sophisticated, we cannot ensure that it will always work on either your end or our end. If the connection with a video visit is poor, the visit may have to be switched to a telephone visit. With either a video or telephone visit, we are not always able to ensure that we have a secure connection.  By engaging in this virtual visit, you consent to the provision of healthcare and authorize for your insurance to be billed (if applicable) for the services provided during this visit. Depending on your insurance coverage, you may receive a charge related to this service.  I need to obtain your verbal consent now. Are you willing to proceed with your visit today? Justin Morrow has provided verbal consent on 01/02/2022 for a virtual visit (video or telephone). Dellia Nims, FNP  Date: 01/02/2022 6:58 PM  Virtual Visit via Video Note   I, Dellia Nims, connected with  Justin Morrow  ([id number], 01/07/1986) on 01/02/22 at  7:00 PM EST by a video-enabled telemedicine application and verified that I am speaking with the correct person using two identifiers.  Location: Patient: Virtual Visit Location Patient:  Home Provider: Virtual Visit Location Provider: Home Office   I discussed the limitations of evaluation and management by telemedicine and the availability of in person appointments. The patient expressed understanding and agreed to proceed.    History of Present Illness: Justin Morrow is a 36 y.o. who identifies as a male who was assigned male at birth, and is being seen today for left ear pain with significant PMH of OM with over 25 surgeries. He started this am with left ear pain and pressure. No drainage. No fever. His .  HPI: HPI  Problems:  Patient Active Problem List   Diagnosis Date Noted   Degeneration of lumbar intervertebral disc 04/28/2021   Lumbar spondylosis 04/28/2021   Wellness examination 04/28/2021   Gross hematuria 10/25/2020   Ischial bursitis of left side 10/08/2020   Spinal stenosis at L4-L5 level 05/29/2020   Lumbosacral radiculopathy 05/23/2020   Low back pain 04/24/2020   Hip pain 04/24/2020   Strain of calf muscle 04/24/2020   Prediabetes 04/24/2020   Hyperlipidemia 04/24/2020   Transaminitis 04/24/2020    Allergies: No Known Allergies Medications:  Current Outpatient Medications:    amoxicillin-clavulanate (AUGMENTIN) 875-125 MG tablet, Take 1 tablet by mouth 2 (two) times daily., Disp: 20 tablet, Rfl: 0   ciprofloxacin-dexamethasone (CIPRODEX) OTIC suspension, Place 4 drops into the left ear 2 (two) times daily for 5 days., Disp: 2 mL, Rfl: 0  Observations/Objective: Patient is well-developed, well-nourished in no acute distress.  Resting comfortably  at home.  Head is normocephalic, atraumatic.  No labored breathing.  Speech is clear and coherent with logical content.  Patient is alert and oriented at baseline.    Assessment and Plan: 1. Left otitis media, unspecified otitis media type  Increase fluids, ibuprofen as directed, urgent care if sx worsen.   Follow Up Instructions: I discussed the assessment and treatment plan with the  patient. The patient was provided an opportunity to ask questions and all were answered. The patient agreed with the plan and demonstrated an understanding of the instructions.  A copy of instructions were sent to the patient via MyChart unless otherwise noted below.     The patient was advised to call back or seek an in-person evaluation if the symptoms worsen or if the condition fails to improve as anticipated.  Time:  I spent 10 minutes with the patient via telehealth technology discussing the above problems/concerns.    Dellia Nims, FNP

## 2022-01-06 ENCOUNTER — Ambulatory Visit (HOSPITAL_BASED_OUTPATIENT_CLINIC_OR_DEPARTMENT_OTHER): Payer: No Typology Code available for payment source | Admitting: Physical Therapy

## 2022-01-06 ENCOUNTER — Encounter (HOSPITAL_BASED_OUTPATIENT_CLINIC_OR_DEPARTMENT_OTHER): Payer: Self-pay | Admitting: Physical Therapy

## 2022-01-06 DIAGNOSIS — R262 Difficulty in walking, not elsewhere classified: Secondary | ICD-10-CM

## 2022-01-06 DIAGNOSIS — M25551 Pain in right hip: Secondary | ICD-10-CM | POA: Diagnosis not present

## 2022-01-06 NOTE — Therapy (Signed)
OUTPATIENT PHYSICAL THERAPY THORACOLUMBAR EVALUATION   Patient Name: Justin Morrow MRN: [id number] DOB:03-07-1985, 36 y.o., male Today's Date: 01/06/2022  END OF SESSION:  PT End of Session - 01/06/22 1148     Visit Number 2    Number of Visits 5    Date for PT Re-Evaluation 01/30/22    Authorization Type [facility] focus    PT Start Time 1148    PT Stop Time 1228    PT Time Calculation (min) 40 min    Activity Tolerance Patient tolerated treatment well    Behavior During Therapy [facility] for tasks assessed/performed             History reviewed. No pertinent past medical history. Past Surgical History:  Procedure Laterality Date   KNEE ARTHROSCOPY  1999   SPINE SURGERY  12/29/2005   Patient Active Problem List   Diagnosis Date Noted   Degeneration of lumbar intervertebral disc 04/28/2021   Lumbar spondylosis 04/28/2021   Wellness examination 04/28/2021   Gross hematuria 10/25/2020   Ischial bursitis of left side 10/08/2020   Spinal stenosis at L4-L5 level 05/29/2020   Lumbosacral radiculopathy 05/23/2020   Low back pain 04/24/2020   Hip pain 04/24/2020   Strain of calf muscle 04/24/2020   Prediabetes 04/24/2020   Hyperlipidemia 04/24/2020   Transaminitis 04/24/2020    REFERRING PROVIDER: Burnard Bunting, MD  REFERRING DIAG:  M79.606 (ICD-10-CM) - Pain of lower extremity, unspecified laterality     Rationale for Evaluation and Treatment: Rehabilitation  THERAPY DIAG:  Pain in right hip  Difficulty in walking, not elsewhere classified  ONSET DATE: a few weeks ago  SUBJECTIVE:                                                                                                                                                                                           SUBJECTIVE STATEMENT: I have been doing the exercises and foam rolling and cna feel piriformis. Feels better but still tight. HS not tightening up as much.   PERTINENT HISTORY:  H/o back pain  PAIN:  Are you  having pain? Yes: NPRS scale: n/a/10 Pain location: rt buttock and HS Pain description: tight Aggravating factors: standing still, bending/stretching Relieving factors: moving  PRECAUTIONS: None  WEIGHT BEARING RESTRICTIONS: No  FALLS:  Has patient fallen in last 6 months? No  OCCUPATION: psyD  PLOF: Independent  PATIENT GOALS: decrease pain, back to activity    OBJECTIVE:   POSTURE:  Anterior lean of the body and standing when viewing laterally  PALPATION: Tightness limiting Rt LE ADD Tightness noted in bil HS and piriformis with tenderness to Rt side  LUMBAR ROM:   AROM eval  Flexion   Extension   Right lateral flexion   Left lateral flexion   Right rotation   Left rotation    (Blank rows = not tested)  LOWER EXTREMITY ROM:     Within functional limits  LOWER EXTREMITY MMT:    Gross 5 out of 5 hip abduction with discomfort on the right side GAIT: Distance walked: Ambulated within clinic Assistive device utilized: None Level of assistance: Complete Independence Comments: Right foot strike lateral to medial versus heel-to-toe on the left side  TODAY'S TREATMENT:                                                                                                                               Treatment                            01/06/22:  Trigger Point Dry Needling, Manual Therapy Treatment:  Initial or subsequent education regarding Trigger Point Dry Needling: Subsequent Did patient give consent to treatment with Trigger Point Dry Needling: Yes TPDN with skilled palpation and monitoring followed by STM to the following muscles: Rt piriformis, glut med/min  Figure 4 stretch Active hamstring stretch Dead lift 5lb- standing and with tailbone against wall  Single leg golfer hinge with bar Supine single leg lift+knee drive, slow Supine SLR with slight knee bend- core engagement and stopping with HS stretch   Treatment                             12/18/21  Trigger Point Dry Needling, Manual Therapy Treatment:  Initial or subsequent education regarding Trigger Point Dry Needling: Subsequent Did patient give consent to treatment with Trigger Point Dry Needling: Yes TPDN with skilled palpation and monitoring followed by STM to the following muscles: Rt HS and piriformis   Seated hamstring stretch Seated piriformis stretch Standing slant board stretch   PATIENT EDUCATION:  Education details: Anatomy of condition, POC, HEP, exercise form/rationale Person educated: Patient Education method: Consulting civil engineer, Demonstration, Tactile cues, Verbal cues, and Handouts Education comprehension: verbalized understanding, returned demonstration, verbal cues required, tactile cues required, and needs further education  HOME EXERCISE PROGRAM: Twice daily: Seated hamstring stretch seated piriformis stretch standing gastroc stretch  ASSESSMENT:  CLINICAL IMPRESSION: Notable limitation in available hip hinge as result of guarding for lumbar spine.  Difficulty in supine straight leg raise with focus on abdominal activation.  OBJECTIVE IMPAIRMENTS: Abnormal gait, decreased activity tolerance, impaired flexibility, improper body mechanics, and pain.   ACTIVITY LIMITATIONS: standing, squatting, and runnin  PARTICIPATION LIMITATIONS: community activity  PERSONAL FACTORS: 1 comorbidity: h/o back pain  are also affecting patient's functional outcome.   REHAB POTENTIAL: Good  CLINICAL DECISION MAKING: Stable/uncomplicated  EVALUATION COMPLEXITY: Low   GOALS: Goals reviewed with patient? Yes  LONG TERM GOALS: Target date: 01/30/22  Pt will demo  proper form in long term stretching program Baseline:  Goal status: INITIAL  2.  Able to jog without distal symptoms Baseline:  Goal status: INITIAL  3.  Resolution of HS pain and excessive tightness Baseline:  Goal status: INITIAL    PLAN:  PT FREQUENCY: 1x/week  PT DURATION: 4  weeks  PLANNED INTERVENTIONS: Therapeutic exercises, Therapeutic activity, Neuromuscular re-education, Balance training, Gait training, Patient/Family education, Self Care, Joint mobilization, Joint manipulation, Stair training, Aquatic Therapy, Dry Needling, Electrical stimulation, Spinal mobilization, Cryotherapy, Moist heat, Taping, Ionotophoresis '4mg'$ /ml Dexamethasone, and Manual therapy.  PLAN FOR NEXT SESSION: DN PRN, hip abd strength progressions, continue hip hinge   Enzo Treu C. Junior Kenedy PT, DPT 01/06/22 1:23 PM

## 2022-01-15 ENCOUNTER — Encounter (HOSPITAL_BASED_OUTPATIENT_CLINIC_OR_DEPARTMENT_OTHER): Payer: Self-pay | Admitting: Physical Therapy

## 2022-01-19 ENCOUNTER — Ambulatory Visit (HOSPITAL_BASED_OUTPATIENT_CLINIC_OR_DEPARTMENT_OTHER): Payer: 59 | Attending: Family Medicine | Admitting: Physical Therapy

## 2022-01-19 ENCOUNTER — Encounter (HOSPITAL_BASED_OUTPATIENT_CLINIC_OR_DEPARTMENT_OTHER): Payer: Self-pay | Admitting: Physical Therapy

## 2022-01-19 DIAGNOSIS — R2689 Other abnormalities of gait and mobility: Secondary | ICD-10-CM | POA: Diagnosis not present

## 2022-01-19 DIAGNOSIS — R262 Difficulty in walking, not elsewhere classified: Secondary | ICD-10-CM | POA: Insufficient documentation

## 2022-01-19 DIAGNOSIS — M25551 Pain in right hip: Secondary | ICD-10-CM | POA: Insufficient documentation

## 2022-01-19 NOTE — Therapy (Signed)
OUTPATIENT PHYSICAL THERAPY THORACOLUMBAR EVALUATION   Patient Name: Justin Morrow MRN: [id number] DOB:October 20, 1985, 37 y.o., male Today's Date: 01/19/2022  END OF SESSION:  PT End of Session - 01/19/22 1645     Visit Number 3    Number of Visits 5    Date for PT Re-Evaluation 01/30/22    Authorization Type [facility] focus    PT Start Time 1645    PT Stop Time 1714    PT Time Calculation (min) 29 min    Activity Tolerance Patient tolerated treatment well    Behavior During Therapy [facility] for tasks assessed/performed             History reviewed. No pertinent past medical history. Past Surgical History:  Procedure Laterality Date   KNEE ARTHROSCOPY  1999   SPINE SURGERY  12/29/2005   Patient Active Problem List   Diagnosis Date Noted   Degeneration of lumbar intervertebral disc 04/28/2021   Lumbar spondylosis 04/28/2021   Wellness examination 04/28/2021   Gross hematuria 10/25/2020   Ischial bursitis of left side 10/08/2020   Spinal stenosis at L4-L5 level 05/29/2020   Lumbosacral radiculopathy 05/23/2020   Low back pain 04/24/2020   Hip pain 04/24/2020   Strain of calf muscle 04/24/2020   Prediabetes 04/24/2020   Hyperlipidemia 04/24/2020   Transaminitis 04/24/2020    REFERRING PROVIDER: Burnard Bunting, MD  REFERRING DIAG:  M79.606 (ICD-10-CM) - Pain of lower extremity, unspecified laterality     Rationale for Evaluation and Treatment: Rehabilitation  THERAPY DIAG:  Pain in right hip  Difficulty in walking, not elsewhere classified  Other abnormalities of gait and mobility  ONSET DATE: a few weeks ago  SUBJECTIVE:                                                                                                                                                                                           SUBJECTIVE STATEMENT: In general, doing better. Can stand just fine but I notice most pain in the AM. Most tightness notable on Rt glut and piriformis.   PERTINENT  HISTORY:  H/o back pain  PAIN:  Are you having pain? Yes: NPRS scale: n/a/10 Pain location: rt buttock and HS Pain description: tight Aggravating factors: standing still, bending/stretching Relieving factors: moving  PRECAUTIONS: None  WEIGHT BEARING RESTRICTIONS: No  FALLS:  Has patient fallen in last 6 months? No  OCCUPATION: psyD  PLOF: Independent  PATIENT GOALS: decrease pain, back to activity    OBJECTIVE:   POSTURE:  Anterior lean of the body and standing when viewing laterally  PALPATION: Tightness limiting Rt LE ADD Tightness noted in bil HS and  piriformis with tenderness to Rt side  LUMBAR ROM:   AROM eval  Flexion   Extension   Right lateral flexion   Left lateral flexion   Right rotation   Left rotation    (Blank rows = not tested)  LOWER EXTREMITY ROM:     Within functional limits  LOWER EXTREMITY MMT:    Gross 5 out of 5 hip abduction with discomfort on the right side GAIT: Distance walked: Ambulated within clinic Assistive device utilized: None Level of assistance: Complete Independence Comments: Right foot strike lateral to medial versus heel-to-toe on the left side  TODAY'S TREATMENT:                                                                                                                               Treatment                            01/19/22:  Foam rolling piriformis & ITB Plank review   Treatment                            01/06/22:  Trigger Point Dry Needling, Manual Therapy Treatment:  Initial or subsequent education regarding Trigger Point Dry Needling: Subsequent Did patient give consent to treatment with Trigger Point Dry Needling: Yes TPDN with skilled palpation and monitoring followed by STM to the following muscles: Rt piriformis, glut med/min  Figure 4 stretch Active hamstring stretch Dead lift 5lb- standing and with tailbone against wall  Single leg golfer hinge with bar Supine single leg lift+knee drive,  slow Supine SLR with slight knee bend- core engagement and stopping with HS stretch   Treatment                            12/18/21  Trigger Point Dry Needling, Manual Therapy Treatment:  Initial or subsequent education regarding Trigger Point Dry Needling: Subsequent Did patient give consent to treatment with Trigger Point Dry Needling: Yes TPDN with skilled palpation and monitoring followed by STM to the following muscles: Rt HS and piriformis   Seated hamstring stretch Seated piriformis stretch Standing slant board stretch   PATIENT EDUCATION:  Education details: Anatomy of condition, POC, HEP, exercise form/rationale Person educated: Patient Education method: Consulting civil engineer, Demonstration, Tactile cues, Verbal cues, and Handouts Education comprehension: verbalized understanding, returned demonstration, verbal cues required, tactile cues required, and needs further education  HOME EXERCISE PROGRAM: Twice daily: Seated hamstring stretch seated piriformis stretch standing gastroc stretch [id number]  ASSESSMENT:  CLINICAL IMPRESSION: Pt has been able to maintain a more relaxed state of the musculature but will benefit from further core stability training. Reviewed foam rolling form for ITB and piriformis to improve pliability for stretching.   OBJECTIVE IMPAIRMENTS: Abnormal gait, decreased activity tolerance, impaired flexibility, improper body mechanics, and pain.   ACTIVITY LIMITATIONS:  standing, squatting, and runnin  PARTICIPATION LIMITATIONS: community activity  PERSONAL FACTORS: 1 comorbidity: h/o back pain  are also affecting patient's functional outcome.   REHAB POTENTIAL: Good  CLINICAL DECISION MAKING: Stable/uncomplicated  EVALUATION COMPLEXITY: Low   GOALS: Goals reviewed with patient? Yes  LONG TERM GOALS: Target date: 01/30/22  Pt will demo proper form in long term stretching program Baseline:  Goal status: INITIAL  2.  Able to jog without distal  symptoms Baseline:  Goal status: INITIAL  3.  Resolution of HS pain and excessive tightness Baseline:  Goal status: INITIAL    PLAN:  PT FREQUENCY: 1x/week  PT DURATION: 4 weeks  PLANNED INTERVENTIONS: Therapeutic exercises, Therapeutic activity, Neuromuscular re-education, Balance training, Gait training, Patient/Family education, Self Care, Joint mobilization, Joint manipulation, Stair training, Aquatic Therapy, Dry Needling, Electrical stimulation, Spinal mobilization, Cryotherapy, Moist heat, Taping, Ionotophoresis '4mg'$ /ml Dexamethasone, and Manual therapy.  PLAN FOR NEXT SESSION: DN PRN, hip abd strength progressions, continue hip hinge   Essica Kiker C. Tanis Hensarling PT, DPT 01/19/22 5:16 PM

## 2022-01-29 ENCOUNTER — Encounter (HOSPITAL_BASED_OUTPATIENT_CLINIC_OR_DEPARTMENT_OTHER): Payer: Self-pay | Admitting: Physical Therapy

## 2022-02-03 ENCOUNTER — Encounter (HOSPITAL_BASED_OUTPATIENT_CLINIC_OR_DEPARTMENT_OTHER): Payer: Self-pay | Admitting: Family Medicine

## 2022-02-04 DIAGNOSIS — R310 Gross hematuria: Secondary | ICD-10-CM | POA: Diagnosis not present

## 2022-02-17 ENCOUNTER — Encounter (HOSPITAL_BASED_OUTPATIENT_CLINIC_OR_DEPARTMENT_OTHER): Payer: Self-pay | Admitting: Family Medicine

## 2022-02-17 ENCOUNTER — Ambulatory Visit (INDEPENDENT_AMBULATORY_CARE_PROVIDER_SITE_OTHER): Payer: 59 | Admitting: Family Medicine

## 2022-02-17 ENCOUNTER — Encounter: Payer: Self-pay | Admitting: Physician Assistant

## 2022-02-17 VITALS — BP 125/71 | HR 72 | Ht 66.5 in | Wt 137.0 lb

## 2022-02-17 DIAGNOSIS — K921 Melena: Secondary | ICD-10-CM | POA: Diagnosis not present

## 2022-02-17 NOTE — Progress Notes (Signed)
   Established Patient Office Visit  Subjective   Patient ID: Justin Morrow, male    DOB: 1985/04/25  Age: 37 y.o. MRN: [id number]  Chief Complaint  Patient presents with   Rectal Bleeding    Pt here for having blood in his stools, stated it started a couple of weeks ago    HPI The last couple of weeks has noticed blood on stool, one loose stool x 1 which is not normal for him. Has happened Intermittently. Has not noticed having a hemorrhoid, family history of colon cancer. Concerned for colon cancer due to family history.   Denies chest pain, shortness of breath, fatigue, or weakness. No overt bleeding.    Review of Systems  Constitutional:  Negative for chills, fever and malaise/fatigue.  Respiratory:  Negative for shortness of breath.   Cardiovascular:  Negative for chest pain.  Gastrointestinal:  Positive for blood in stool and diarrhea. Negative for abdominal pain, melena, nausea and vomiting.  Neurological:  Negative for dizziness, weakness and headaches.      Objective:     BP 125/71 (BP Location: Left Arm, Patient Position: Sitting, Cuff Size: Large)   Pulse 72   Ht 5' 6.5" (1.689 m)   Wt 137 lb (62.1 kg)   SpO2 100%   BMI 21.78 kg/m    Physical Exam Vitals and nursing note reviewed. Exam conducted with a chaperone present.  Constitutional:      General: He is not in acute distress.    Appearance: Normal appearance. He is normal weight.  Cardiovascular:     Rate and Rhythm: Normal rate.  Pulmonary:     Effort: Pulmonary effort is normal.  Genitourinary:    Rectum: Normal. No external hemorrhoid.  Skin:    General: Skin is warm and dry.  Neurological:     General: No focal deficit present.     Mental Status: He is alert. Mental status is at baseline.  Psychiatric:        Mood and Affect: Mood normal.        Behavior: Behavior normal.        Thought Content: Thought content normal.        Judgment: Judgment normal.      No results found for any  visits on 02/17/22.    The ASCVD Risk score (Arnett DK, et al., 2019) failed to calculate for the following reasons:   The 2019 ASCVD risk score is only valid for ages 62 to 37    Assessment & Plan:   Problem List Items Addressed This Visit     Blood in stool - Primary    Reports seeing blood in his stools intermittently over the last couple of weeks.  He has also had 1 loose stool which is not normal.  Has a family history of colon cancer. Will get fecal occult blood and return to office. No external hemorrhoids noted today. Amb referral placed to GI for further evaluation.       Relevant Orders   Fecal occult blood, imunochemical   Ambulatory referral to Gastroenterology  Agrees with plan of care discussed.  Questions answered.   Return if symptoms worsen or fail to improve.    Chalmers Guest, FNP

## 2022-02-17 NOTE — Assessment & Plan Note (Addendum)
Reports seeing blood in his stools intermittently over the last couple of weeks.  He has also had 1 loose stool which is not normal.  Has a family history of colon cancer. Will get fecal occult blood and return to office. No external hemorrhoids noted today. Amb referral placed to GI for further evaluation.

## 2022-02-18 DIAGNOSIS — K921 Melena: Secondary | ICD-10-CM | POA: Diagnosis not present

## 2022-02-19 LAB — FECAL OCCULT BLOOD, IMMUNOCHEMICAL: Fecal Occult Bld: NEGATIVE

## 2022-02-19 NOTE — Therapy (Signed)
OUTPATIENT PHYSICAL THERAPY THORACOLUMBAR EVALUATION   Patient Name: Isandro Schorer MRN: [id number] DOB:[birth year], 37 y.o., male Today's Date: 02/20/2022  END OF SESSION:  PT End of Session - 02/20/22 1015     Visit Number 4    Number of Visits 10    Date for PT Re-Evaluation 04/03/22    Authorization Type [facility] [employer]    PT Start Time 1015    PT Stop Time 1054    PT Time Calculation (min) 39 min    Activity Tolerance Patient tolerated treatment well    Behavior During Therapy [facility] for tasks assessed/performed              History reviewed. No pertinent past medical history. Past Surgical History:  Procedure Laterality Date   KNEE ARTHROSCOPY  1999   SPINE SURGERY  12/29/2005   Patient Active Problem List   Diagnosis Date Noted   Blood in stool 02/17/2022   Degeneration of lumbar intervertebral disc 04/28/2021   Lumbar spondylosis 04/28/2021   Wellness examination 04/28/2021   Gross hematuria 10/25/2020   Ischial bursitis of left side 10/08/2020   Spinal stenosis at L4-L5 level 05/29/2020   Lumbosacral radiculopathy 05/23/2020   Low back pain 04/24/2020   Hip pain 04/24/2020   Strain of calf muscle 04/24/2020   Prediabetes 04/24/2020   Hyperlipidemia 04/24/2020   Transaminitis 04/24/2020    REFERRING PROVIDER: Burnard Bunting, MD  REFERRING DIAG:  M79.606 (ICD-10-CM) - Pain of lower extremity, unspecified laterality     Rationale for Evaluation and Treatment: Rehabilitation  THERAPY DIAG:  Pain in right hip  Difficulty in walking, not elsewhere classified  ONSET DATE: a few weeks ago  SUBJECTIVE:                                                                                                                                                                                           SUBJECTIVE STATEMENT: Knee to chest in supine increased Rt ITB pain. Lt HS feels like it is tightening, tightness in Rt post hip with quick motions.   PERTINENT HISTORY:  H/o back  pain  PAIN:  Are you having pain? Yes: NPRS scale: n/a/10 Pain location: rt buttock and HS Pain description: tight Aggravating factors: standing still, bending/stretching Relieving factors: moving  PRECAUTIONS: None  WEIGHT BEARING RESTRICTIONS: No  FALLS:  Has patient fallen in last 6 months? No  OCCUPATION: psyD  PLOF: Independent  PATIENT GOALS: decrease pain, back to activity    OBJECTIVE:   POSTURE:  Anterior lean of the body and standing when viewing laterally  2/9: Rt lateral shift of thoracic spine with decrease kyphosis, decr lumbar  lordosis and slight forward flexion  PALPATION: Tightness limiting Rt LE ADD Tightness noted in bil HS and piriformis with tenderness to Rt side  LUMBAR ROM:   2/9- WFL with notable correction of shift in further flexion  LOWER EXTREMITY ROM:     Within functional limits  LOWER EXTREMITY MMT:    Gross 5 out of 5 hip abduction with discomfort on the right side GAIT: Distance walked: Ambulated within clinic Assistive device utilized: None Level of assistance: Complete Independence Comments: Right foot strike lateral to medial versus heel-to-toe on the left side  TODAY'S TREATMENT:                                                                                                                               Treatment                            02/20/22:   Prone superman from table tilted down, upper half then lower half, opp UE/LE Lt sidelying- PT assisted to obtain neutral spinal alignment, Rt shoulder ABD with oblique set to pull adduction 4lb Rt sidelying hip circles 4lb ankle cuff- pillow under Rt rib cage Half kneel on left side with forward reach & glut set to upright hinge Resting posture with LEFT foot back   Treatment                            01/19/22:  Foam rolling piriformis & ITB Plank review   Treatment                            01/06/22:  Trigger Point Dry Needling, Manual Therapy Treatment:  Initial  or subsequent education regarding Trigger Point Dry Needling: Subsequent Did patient give consent to treatment with Trigger Point Dry Needling: Yes TPDN with skilled palpation and monitoring followed by STM to the following muscles: Rt piriformis, glut med/min  Figure 4 stretch Active hamstring stretch Dead lift 5lb- standing and with tailbone against wall  Single leg golfer hinge with bar Supine single leg lift+knee drive, slow Supine SLR with slight knee bend- core engagement and stopping with HS stretch    PATIENT EDUCATION:  Education details: Geophysicist/field seismologist of condition, POC, HEP, exercise form/rationale Person educated: Patient Education method: Consulting civil engineer, Demonstration, Tactile cues, Verbal cues, and Handouts Education comprehension: verbalized understanding, returned demonstration, verbal cues required, tactile cues required, and needs further education  HOME EXERCISE PROGRAM: Twice daily: Seated hamstring stretch seated piriformis stretch standing gastroc stretch [id number]  ASSESSMENT:  CLINICAL IMPRESSION:  prone hip ext created concordant pain at Rt proximal HS insertion.  Notable postural anomalies with forward flexion at hips and right lateral shift of thoracic spine hinge point notable just above L1.  Will work to engage right upper abdominal musculature as well as left lateral hip to stabilize from  lateral pulls, glutes activation for upright posture via the pelvis.  Signs and symptoms consistent with proximal insertional tendinitis of hamstrings and bursa irritation as a result of postural changes.  Asked that he sit on ice a couple of times a day for about 10 minutes to reduce irritation.  Will extend plan of care to weekly for the next 6 weeks in order to correct postural changes to avoid returning to prior level of pain and functional limitations.  OBJECTIVE IMPAIRMENTS: Abnormal gait, decreased activity tolerance, impaired flexibility, improper body mechanics, and pain.    ACTIVITY LIMITATIONS: standing, squatting, and runnin  PARTICIPATION LIMITATIONS: community activity  PERSONAL FACTORS: 1 comorbidity: h/o back pain  are also affecting patient's functional outcome.   REHAB POTENTIAL: Good  CLINICAL DECISION MAKING: Stable/uncomplicated  EVALUATION COMPLEXITY: Low   GOALS: Goals reviewed with patient? Yes  LONG TERM GOALS: Target date: POC date  Pt will demo proper form in long term stretching program Baseline:  Goal status: achieved  2.  Able to jog without distal symptoms Baseline: has not jogged Goal status: not measured  3.  Resolution of HS pain and excessive tightness Baseline: tightness is on and off, primarily in the AM Goal status: ongoing  4.  Demo upright , neutral posture Baseline:  Goal status: INITIAL 5.  Able to stand from charis without sharp pain in Rt proximal HS/gluts consistently for at least 3 days in a row Baseline:  Goal status: INITIAL     PLAN:  PT FREQUENCY: 1x/week  PT DURATION: 4 weeks  PLANNED INTERVENTIONS: Therapeutic exercises, Therapeutic activity, Neuromuscular re-education, Balance training, Gait training, Patient/Family education, Self Care, Joint mobilization, Joint manipulation, Stair training, Aquatic Therapy, Dry Needling, Electrical stimulation, Spinal mobilization, Cryotherapy, Moist heat, Taping, Ionotophoresis 39m/ml Dexamethasone, and Manual therapy.  PLAN FOR NEXT SESSION: DN PRN, hip abd strength progressions, continue hip hinge   Torrin Frein C. Viliami Bracco PT, DPT 02/20/22 8:43 PM

## 2022-02-20 ENCOUNTER — Ambulatory Visit (HOSPITAL_BASED_OUTPATIENT_CLINIC_OR_DEPARTMENT_OTHER): Payer: 59 | Attending: Family Medicine | Admitting: Physical Therapy

## 2022-02-20 ENCOUNTER — Encounter (HOSPITAL_BASED_OUTPATIENT_CLINIC_OR_DEPARTMENT_OTHER): Payer: Self-pay | Admitting: Physical Therapy

## 2022-02-20 DIAGNOSIS — R262 Difficulty in walking, not elsewhere classified: Secondary | ICD-10-CM | POA: Diagnosis not present

## 2022-02-20 DIAGNOSIS — R2689 Other abnormalities of gait and mobility: Secondary | ICD-10-CM | POA: Diagnosis not present

## 2022-02-20 DIAGNOSIS — M25551 Pain in right hip: Secondary | ICD-10-CM | POA: Diagnosis not present

## 2022-02-23 ENCOUNTER — Encounter (HOSPITAL_BASED_OUTPATIENT_CLINIC_OR_DEPARTMENT_OTHER): Payer: Self-pay | Admitting: Physical Therapy

## 2022-02-23 ENCOUNTER — Ambulatory Visit (HOSPITAL_BASED_OUTPATIENT_CLINIC_OR_DEPARTMENT_OTHER): Payer: 59 | Admitting: Physical Therapy

## 2022-02-23 DIAGNOSIS — M25551 Pain in right hip: Secondary | ICD-10-CM

## 2022-02-23 DIAGNOSIS — R262 Difficulty in walking, not elsewhere classified: Secondary | ICD-10-CM | POA: Diagnosis not present

## 2022-02-23 DIAGNOSIS — R2689 Other abnormalities of gait and mobility: Secondary | ICD-10-CM

## 2022-02-23 NOTE — Therapy (Signed)
OUTPATIENT PHYSICAL THERAPY THORACOLUMBAR EVALUATION   Patient Name: Justin Morrow MRN: [id number] DOB:02/20/1985, 37 y.o., male Today's Date: 02/23/2022  END OF SESSION:  PT End of Session - 02/23/22 1435     Visit Number 5    Number of Visits 10    Date for PT Re-Evaluation 04/03/22    Authorization Type [facility] [employer]    PT Start Time 1435    PT Stop Time 1515    PT Time Calculation (min) 40 min    Activity Tolerance Patient tolerated treatment well    Behavior During Therapy [facility] for tasks assessed/performed              History reviewed. No pertinent past medical history. Past Surgical History:  Procedure Laterality Date   KNEE ARTHROSCOPY  1999   SPINE SURGERY  12/29/2005   Patient Active Problem List   Diagnosis Date Noted   Blood in stool 02/17/2022   Degeneration of lumbar intervertebral disc 04/28/2021   Lumbar spondylosis 04/28/2021   Wellness examination 04/28/2021   Gross hematuria 10/25/2020   Ischial bursitis of left side 10/08/2020   Spinal stenosis at L4-L5 level 05/29/2020   Lumbosacral radiculopathy 05/23/2020   Low back pain 04/24/2020   Hip pain 04/24/2020   Strain of calf muscle 04/24/2020   Prediabetes 04/24/2020   Hyperlipidemia 04/24/2020   Transaminitis 04/24/2020    REFERRING PROVIDER: Burnard Bunting, MD  REFERRING DIAG:  M79.606 (ICD-10-CM) - Pain of lower extremity, unspecified laterality     Rationale for Evaluation and Treatment: Rehabilitation  THERAPY DIAG:  Pain in right hip  Difficulty in walking, not elsewhere classified  Other abnormalities of gait and mobility  ONSET DATE: a few weeks ago  SUBJECTIVE:                                                                                                                                                                                           SUBJECTIVE STATEMENT: Worsened over weekend. I feel the Right hamstrings and gluts like crazy.   PERTINENT HISTORY:  H/o back  pain  PAIN:  Are you having pain? Yes: NPRS scale: n/a/10 Pain location: rt buttock and HS Pain description: tight Aggravating factors: standing still, bending/stretching Relieving factors: moving  PRECAUTIONS: None  WEIGHT BEARING RESTRICTIONS: No  FALLS:  Has patient fallen in last 6 months? No  OCCUPATION: psyD  PLOF: Independent  PATIENT GOALS: decrease pain, back to activity    OBJECTIVE:   POSTURE:  Anterior lean of the body and standing when viewing laterally  2/9: Rt lateral shift of thoracic spine with decrease kyphosis, decr lumbar lordosis and slight forward flexion  2/12: 5 deg Rt shift in lumbar spine from midline of LEs, Rt deg tilt inferiorly on the right per inclinometer  PALPATION: Tightness limiting Rt LE ADD Tightness noted in bil HS and piriformis with tenderness to Rt side  LUMBAR ROM:   2/9- WFL with notable correction of shift in further flexion  LOWER EXTREMITY ROM:     Within functional limits  LOWER EXTREMITY MMT:    Gross 5 out of 5 hip abduction with discomfort on the right side GAIT: Distance walked: Ambulated within clinic Assistive device utilized: None Level of assistance: Complete Independence Comments: Right foot strike lateral to medial versus heel-to-toe on the left side  TODAY'S TREATMENT:                                                                                                                               Treatment                            02/23/22:  Trialed shift correction at wall but did not tolerate well Trigger Point Dry Needling, Manual Therapy Treatment:  Initial or subsequent education regarding Trigger Point Dry Needling: Subsequent Did patient give consent to treatment with Trigger Point Dry Needling: Yes TPDN with skilled palpation and monitoring followed by STM to the following muscles: Rt T11-L5 paraspinals  Qped to child pose rocking with hands to L to stretch Rt side Prone PA spring Rt upper  quadrant Bil sciatic nerve glides Small rage LTR Therace- discussed child carrying posture   Treatment                            02/20/22:   Prone superman from table tilted down, upper half then lower half, opp UE/LE Lt sidelying- PT assisted to obtain neutral spinal alignment, Rt shoulder ABD with oblique set to pull adduction 4lb Rt sidelying hip circles 4lb ankle cuff- pillow under Rt rib cage Half kneel on left side with forward reach & glut set to upright hinge Resting posture with LEFT foot back   Treatment                            01/19/22:  Foam rolling piriformis & ITB Plank review   PATIENT EDUCATION:  Education details: Geophysicist/field seismologist of condition, POC, HEP, exercise form/rationale Person educated: Patient Education method: Consulting civil engineer, Demonstration, Tactile cues, Verbal cues, and Handouts Education comprehension: verbalized understanding, returned demonstration, verbal cues required, tactile cues required, and needs further education  HOME EXERCISE PROGRAM: Twice daily: Seated hamstring stretch seated piriformis stretch standing gastroc stretch [id number]  ASSESSMENT:  CLINICAL IMPRESSION: Cont significant shift in standing creating excessive demand on bil gluts and HS, s/s inconsistent with radicular symptoms as burning sensation was felt in standing and flexed posture. Suspect HS pain is due to fatigue in holding posture  as upright as possible. Significant twitch response from lumbar paraspinals with concordant pain reported upon palpation to Rt L5-S1 region. Pt reports that he does always carry the baby on the right side and agreed to try to avoid doing so. Advised of using an assisted carrier like a tush baby as an option.   OBJECTIVE IMPAIRMENTS: Abnormal gait, decreased activity tolerance, impaired flexibility, improper body mechanics, and pain.   ACTIVITY LIMITATIONS: standing, squatting, and runnin  PARTICIPATION LIMITATIONS: community activity  PERSONAL  FACTORS: 1 comorbidity: h/o back pain  are also affecting patient's functional outcome.   REHAB POTENTIAL: Good  CLINICAL DECISION MAKING: Stable/uncomplicated  EVALUATION COMPLEXITY: Low   GOALS: Goals reviewed with patient? Yes  LONG TERM GOALS: Target date: POC date  Pt will demo proper form in long term stretching program Baseline:  Goal status: achieved  2.  Able to jog without distal symptoms Baseline: has not jogged Goal status: not measured  3.  Resolution of HS pain and excessive tightness Baseline: tightness is on and off, primarily in the AM Goal status: ongoing  4.  Demo upright , neutral posture Baseline:  Goal status: INITIAL 5.  Able to stand from charis without sharp pain in Rt proximal HS/gluts consistently for at least 3 days in a row Baseline:  Goal status: INITIAL     PLAN:  PT FREQUENCY: 1x/week  PT DURATION: 4 weeks  PLANNED INTERVENTIONS: Therapeutic exercises, Therapeutic activity, Neuromuscular re-education, Balance training, Gait training, Patient/Family education, Self Care, Joint mobilization, Joint manipulation, Stair training, Aquatic Therapy, Dry Needling, Electrical stimulation, Spinal mobilization, Cryotherapy, Moist heat, Taping, Ionotophoresis 49m/ml Dexamethasone, and Manual therapy.  PLAN FOR NEXT SESSION: DN PRN, hip abd strength progressions, continue hip hinge   Archer Moist C. Khing Belcher PT, DPT 02/23/22 3:20 PM

## 2022-02-24 ENCOUNTER — Encounter (HOSPITAL_BASED_OUTPATIENT_CLINIC_OR_DEPARTMENT_OTHER): Payer: Self-pay | Admitting: Physical Therapy

## 2022-03-05 ENCOUNTER — Telehealth (HOSPITAL_BASED_OUTPATIENT_CLINIC_OR_DEPARTMENT_OTHER): Payer: Self-pay | Admitting: Family Medicine

## 2022-03-05 NOTE — Telephone Encounter (Signed)
Received vaccines from [place] in providers box

## 2022-03-09 ENCOUNTER — Ambulatory Visit (HOSPITAL_BASED_OUTPATIENT_CLINIC_OR_DEPARTMENT_OTHER): Payer: 59 | Admitting: Physical Therapy

## 2022-03-09 ENCOUNTER — Encounter (HOSPITAL_BASED_OUTPATIENT_CLINIC_OR_DEPARTMENT_OTHER): Payer: Self-pay | Admitting: Physical Therapy

## 2022-03-09 DIAGNOSIS — R262 Difficulty in walking, not elsewhere classified: Secondary | ICD-10-CM | POA: Diagnosis not present

## 2022-03-09 DIAGNOSIS — M25551 Pain in right hip: Secondary | ICD-10-CM

## 2022-03-09 DIAGNOSIS — R2689 Other abnormalities of gait and mobility: Secondary | ICD-10-CM

## 2022-03-09 NOTE — Therapy (Signed)
OUTPATIENT PHYSICAL THERAPY THORACOLUMBAR EVALUATION   Patient Name: Justin Morrow MRN: [id number] DOB:[birth year], 37 y.o., male Today's Date: 03/09/2022  END OF SESSION:  PT End of Session - 03/09/22 1432     Visit Number 6    Number of Visits 10    Date for PT Re-Evaluation 04/03/22    Authorization Type [facility] [employer]    PT Start Time [id number]    PT Stop Time 1514    PT Time Calculation (min) 43 min    Activity Tolerance Patient tolerated treatment well    Behavior During Therapy [facility] for tasks assessed/performed              History reviewed. No pertinent past medical history. Past Surgical History:  Procedure Laterality Date   KNEE ARTHROSCOPY  1999   SPINE SURGERY  12/29/2005   Patient Active Problem List   Diagnosis Date Noted   Blood in stool 02/17/2022   Degeneration of lumbar intervertebral disc 04/28/2021   Lumbar spondylosis 04/28/2021   Wellness examination 04/28/2021   Gross hematuria 10/25/2020   Ischial bursitis of left side 10/08/2020   Spinal stenosis at L4-L5 level 05/29/2020   Lumbosacral radiculopathy 05/23/2020   Low back pain 04/24/2020   Hip pain 04/24/2020   Strain of calf muscle 04/24/2020   Prediabetes 04/24/2020   Hyperlipidemia 04/24/2020   Transaminitis 04/24/2020    REFERRING PROVIDER: Burnard Bunting, MD  REFERRING DIAG:  M79.606 (ICD-10-CM) - Pain of lower extremity, unspecified laterality     Rationale for Evaluation and Treatment: Rehabilitation  THERAPY DIAG:  Pain in right hip  Difficulty in walking, not elsewhere classified  Other abnormalities of gait and mobility  ONSET DATE: a few weeks ago  SUBJECTIVE:                                                                                                                                                                                           SUBJECTIVE STATEMENT: Doing better than last time- yesterday I had a lot of tightness and did the nerve glides that did help but the  effects were short-lived.    PERTINENT HISTORY:  H/o back pain  PAIN:  Are you having pain? Yes: NPRS scale: n/a/10 Pain location: rt buttock and HS Pain description: tight Aggravating factors: standing still, bending/stretching Relieving factors: moving  PRECAUTIONS: None  WEIGHT BEARING RESTRICTIONS: No  FALLS:  Has patient fallen in last 6 months? No  OCCUPATION: psyD  PLOF: Independent  PATIENT GOALS: decrease pain, back to activity    OBJECTIVE:   POSTURE:  Anterior lean of the body and standing when viewing laterally  2/9: Rt lateral  shift of thoracic spine with decrease kyphosis, decr lumbar lordosis and slight forward flexion  2/12: 5 deg Rt shift in lumbar spine from midline of LEs, Rt deg tilt inferiorly on the right per inclinometer  PALPATION: Tightness limiting Rt LE ADD Tightness noted in bil HS and piriformis with tenderness to Rt side  LUMBAR ROM:   2/9- WFL with notable correction of shift in further flexion  LOWER EXTREMITY ROM:     Within functional limits  LOWER EXTREMITY MMT:    Gross 5 out of 5 hip abduction with discomfort on the right side GAIT: Distance walked: Ambulated within clinic Assistive device utilized: None Level of assistance: Complete Independence Comments: Right foot strike lateral to medial versus heel-to-toe on the left side  TODAY'S TREATMENT:                                                                                                                               Treatment                            2/26:  Trigger Point Dry Needling, Manual Therapy Treatment:  Initial or subsequent education regarding Trigger Point Dry Needling: Subsequent Did patient give consent to treatment with Trigger Point Dry Needling: Yes TPDN with skilled palpation and monitoring followed by STM to the following muscles: gastroc, hamstrings semitendinosus, piriformis all on Rt  Self correction for Rt upslip 5 min hold   Treatment                             02/23/22:  Trialed shift correction at wall but did not tolerate well Trigger Point Dry Needling, Manual Therapy Treatment:  Initial or subsequent education regarding Trigger Point Dry Needling: Subsequent Did patient give consent to treatment with Trigger Point Dry Needling: Yes TPDN with skilled palpation and monitoring followed by STM to the following muscles: Rt T11-L5 paraspinals  Qped to child pose rocking with hands to L to stretch Rt side Prone PA spring Rt upper quadrant Bil sciatic nerve glides Small rage LTR Therace- discussed child carrying posture   Treatment                            02/20/22:   Prone superman from table tilted down, upper half then lower half, opp UE/LE Lt sidelying- PT assisted to obtain neutral spinal alignment, Rt shoulder ABD with oblique set to pull adduction 4lb Rt sidelying hip circles 4lb ankle cuff- pillow under Rt rib cage Half kneel on left side with forward reach & glut set to upright hinge Resting posture with LEFT foot back    PATIENT EDUCATION:  Education details: Anatomy of condition, POC, HEP, exercise form/rationale Person educated: Patient Education method: Explanation, Demonstration, Tactile cues, Verbal cues, and Handouts Education comprehension: verbalized understanding, returned demonstration, verbal  cues required, tactile cues required, and needs further education  HOME EXERCISE PROGRAM: Twice daily: Seated hamstring stretch seated piriformis stretch standing gastroc stretch [id number]  ASSESSMENT:  CLINICAL IMPRESSION: improved hip motion & trunk rotation during gait following manual therapy today.  Began with gastroc release with dry needling, evaluated gait, addressed hamstrings, evaluated gait, addressed glutes, and then evaluated gait again.  Noted that each level of release resulted in improved motion.  Apparent right innominate up slip addressed with stretch today with good outcome and without  onset of radicular symptoms.  Will continue to monitor symptoms consistent with neurological input as well as address musculoskeletal limitations to function.  OBJECTIVE IMPAIRMENTS: Abnormal gait, decreased activity tolerance, impaired flexibility, improper body mechanics, and pain.   ACTIVITY LIMITATIONS: standing, squatting, and runnin  PARTICIPATION LIMITATIONS: community activity  PERSONAL FACTORS: 1 comorbidity: h/o back pain  are also affecting patient's functional outcome.   REHAB POTENTIAL: Good  CLINICAL DECISION MAKING: Stable/uncomplicated  EVALUATION COMPLEXITY: Low   GOALS: Goals reviewed with patient? Yes  LONG TERM GOALS: Target date: POC date  Pt will demo proper form in long term stretching program Baseline:  Goal status: achieved  2.  Able to jog without distal symptoms Baseline: has not jogged Goal status: not measured  3.  Resolution of HS pain and excessive tightness Baseline: tightness is on and off, primarily in the AM Goal status: ongoing  4.  Demo upright , neutral posture Baseline:  Goal status: INITIAL 5.  Able to stand from charis without sharp pain in Rt proximal HS/gluts consistently for at least 3 days in a row Baseline:  Goal status: INITIAL     PLAN:  PT FREQUENCY: 1x/week  PT DURATION: 4 weeks  PLANNED INTERVENTIONS: Therapeutic exercises, Therapeutic activity, Neuromuscular re-education, Balance training, Gait training, Patient/Family education, Self Care, Joint mobilization, Joint manipulation, Stair training, Aquatic Therapy, Dry Needling, Electrical stimulation, Spinal mobilization, Cryotherapy, Moist heat, Taping, Ionotophoresis '4mg'$ /ml Dexamethasone, and Manual therapy.  PLAN FOR NEXT SESSION: DN PRN, hip abd strength progressions, continue hip hinge, outcome of DN?   Teana Lindahl C. Raidyn Breiner PT, DPT 03/09/22 4:55 PM

## 2022-03-17 ENCOUNTER — Ambulatory Visit (HOSPITAL_BASED_OUTPATIENT_CLINIC_OR_DEPARTMENT_OTHER): Payer: 59 | Attending: Family Medicine | Admitting: Physical Therapy

## 2022-03-17 ENCOUNTER — Encounter (HOSPITAL_BASED_OUTPATIENT_CLINIC_OR_DEPARTMENT_OTHER): Payer: Self-pay | Admitting: Physical Therapy

## 2022-03-17 DIAGNOSIS — R2689 Other abnormalities of gait and mobility: Secondary | ICD-10-CM

## 2022-03-17 DIAGNOSIS — M25551 Pain in right hip: Secondary | ICD-10-CM

## 2022-03-17 DIAGNOSIS — R262 Difficulty in walking, not elsewhere classified: Secondary | ICD-10-CM | POA: Diagnosis not present

## 2022-03-17 NOTE — Therapy (Signed)
OUTPATIENT PHYSICAL THERAPY THORACOLUMBAR EVALUATION   Patient Name: Justin Morrow MRN: [id number] DOB:06/30/1985, 37 y.o., male Today's Date: 03/17/2022  END OF SESSION:  PT End of Session - 03/17/22 0925     Visit Number 7    Number of Visits 10    Date for PT Re-Evaluation 04/03/22    Authorization Type [facility] [employer]    PT Start Time [phone]    PT Stop Time 1016    PT Time Calculation (min) 47 min    Activity Tolerance Patient tolerated treatment well    Behavior During Therapy [facility] for tasks assessed/performed              History reviewed. No pertinent past medical history. Past Surgical History:  Procedure Laterality Date   KNEE ARTHROSCOPY  1999   SPINE SURGERY  12/29/2005   Patient Active Problem List   Diagnosis Date Noted   Blood in stool 02/17/2022   Degeneration of lumbar intervertebral disc 04/28/2021   Lumbar spondylosis 04/28/2021   Wellness examination 04/28/2021   Gross hematuria 10/25/2020   Ischial bursitis of left side 10/08/2020   Spinal stenosis at L4-L5 level 05/29/2020   Lumbosacral radiculopathy 05/23/2020   Low back pain 04/24/2020   Hip pain 04/24/2020   Strain of calf muscle 04/24/2020   Prediabetes 04/24/2020   Hyperlipidemia 04/24/2020   Transaminitis 04/24/2020    REFERRING PROVIDER: Burnard Bunting, MD  REFERRING DIAG:  M79.606 (ICD-10-CM) - Pain of lower extremity, unspecified laterality     Rationale for Evaluation and Treatment: Rehabilitation  THERAPY DIAG:  Pain in right hip  Difficulty in walking, not elsewhere classified  Other abnormalities of gait and mobility  ONSET DATE: a few weeks ago  SUBJECTIVE:                                                                                                                                                                                           SUBJECTIVE STATEMENT: Feeling better than a week ago. Wonder if I am overdoing it on Saturdays because Sunday mornings are always worse.  Standing 2-3 min I feel the pain down the leg- fine If I am moving. Denies pain past knee- I feel it in the glut and down lateral hamstring.   PERTINENT HISTORY:  H/o back pain  PAIN:  Are you having pain? Yes: NPRS scale: n/a/10 Pain location: rt buttock and HS Pain description: tight Aggravating factors: standing still, bending/stretching Relieving factors: moving  PRECAUTIONS: None  WEIGHT BEARING RESTRICTIONS: No  FALLS:  Has patient fallen in last 6 months? No  OCCUPATION: psyD  PLOF: Independent  PATIENT GOALS: decrease pain, back to  activity    OBJECTIVE:   POSTURE:  Anterior lean of the body and standing when viewing laterally  2/9: Rt lateral shift of thoracic spine with decrease kyphosis, decr lumbar lordosis and slight forward flexion  2/12: 5 deg Rt shift in lumbar spine from midline of LEs, Rt deg tilt inferiorly on the right per inclinometer  PALPATION: Tightness limiting Rt LE ADD Tightness noted in bil HS and piriformis with tenderness to Rt side  3/5: no pain in HS MMT but feels weak and tension when added with IR/ER at hip        Concordant Pain in sidelying hip abd+extension at lateral gluts                   Distal pain with compression to Rt lateral L3 both at muscular level as well as joint mob- not consistent with neural input       LUMBAR ROM:   2/9- WFL with notable correction of shift in further flexion  LOWER EXTREMITY ROM:     Within functional limits  LOWER EXTREMITY MMT:    Gross 5 out of 5 hip abduction with discomfort on the right side GAIT: Distance walked: Ambulated within clinic Assistive device utilized: None Level of assistance: Complete Independence Comments: Right foot strike lateral to medial versus heel-to-toe on the left side  TODAY'S TREATMENT:                                                                                                                               Treatment                             3/5:  Trigger Point Dry Needling, Manual Therapy Treatment:  Initial or subsequent education regarding Trigger Point Dry Needling: Subsequent Did patient give consent to treatment with Trigger Point Dry Needling: Yes TPDN with skilled palpation and monitoring followed by STM to the following muscles: Rt glut med  Ultrasound- 3.3 MHz 100% 8 min Rt glut med Figure 4 stretch Lt sidelying hip abd stretch Seated AHSS Child pose Self release with tennis ball   Treatment                            2/26:  Trigger Point Dry Needling, Manual Therapy Treatment:  Initial or subsequent education regarding Trigger Point Dry Needling: Subsequent Did patient give consent to treatment with Trigger Point Dry Needling: Yes TPDN with skilled palpation and monitoring followed by STM to the following muscles: gastroc, hamstrings semitendinosus, piriformis all on Rt  Self correction for Rt upslip 5 min hold   Treatment                            02/23/22:  Trialed shift correction at wall but did not tolerate well Trigger Point Dry  Needling, Manual Therapy Treatment:  Initial or subsequent education regarding Trigger Point Dry Needling: Subsequent Did patient give consent to treatment with Trigger Point Dry Needling: Yes TPDN with skilled palpation and monitoring followed by STM to the following muscles: Rt T11-L5 paraspinals  Qped to child pose rocking with hands to L to stretch Rt side Prone PA spring Rt upper quadrant Bil sciatic nerve glides Small rage LTR Therace- discussed child carrying posture   PATIENT EDUCATION:  Education details: Anatomy of condition, POC, HEP, exercise form/rationale Person educated: Patient Education method: Explanation, Demonstration, Tactile cues, Verbal cues, and Handouts Education comprehension: verbalized understanding, returned demonstration, verbal cues required, tactile cues required, and needs further education  HOME EXERCISE PROGRAM: Twice daily: Seated  hamstring stretch seated piriformis stretch standing gastroc stretch [id number]  ASSESSMENT:  CLINICAL IMPRESSION: Concordant pain in hip abd+ext turned to soreness and tension in retest following DN & [place] today. Requested that he schedule appt with dr Sammuel Hines for hip evaluation.   OBJECTIVE IMPAIRMENTS: Abnormal gait, decreased activity tolerance, impaired flexibility, improper body mechanics, and pain.   ACTIVITY LIMITATIONS: standing, squatting, and runnin  PARTICIPATION LIMITATIONS: community activity  PERSONAL FACTORS: 1 comorbidity: h/o back pain  are also affecting patient's functional outcome.   REHAB POTENTIAL: Good  CLINICAL DECISION MAKING: Stable/uncomplicated  EVALUATION COMPLEXITY: Low   GOALS: Goals reviewed with patient? Yes  LONG TERM GOALS: Target date: POC date  Pt will demo proper form in long term stretching program Baseline:  Goal status: achieved  2.  Able to jog without distal symptoms Baseline: has not jogged Goal status: not measured  3.  Resolution of HS pain and excessive tightness Baseline: tightness is on and off, primarily in the AM Goal status: ongoing  4.  Demo upright , neutral posture Baseline:  Goal status: INITIAL 5.  Able to stand from charis without sharp pain in Rt proximal HS/gluts consistently for at least 3 days in a row Baseline:  Goal status: INITIAL     PLAN:  PT FREQUENCY: 1x/week  PT DURATION: 4 weeks  PLANNED INTERVENTIONS: Therapeutic exercises, Therapeutic activity, Neuromuscular re-education, Balance training, Gait training, Patient/Family education, Self Care, Joint mobilization, Joint manipulation, Stair training, Aquatic Therapy, Dry Needling, Electrical stimulation, Spinal mobilization, Cryotherapy, Moist heat, Taping, Ionotophoresis '4mg'$ /ml Dexamethasone, and Manual therapy.  PLAN FOR NEXT SESSION: DN PRN, hip abd strength progressions, continue hip hinge, outcome of DN?    Kethan Papadopoulos C. Lakya Schrupp PT,  DPT 03/17/22 10:22 AM

## 2022-03-18 ENCOUNTER — Encounter (HOSPITAL_BASED_OUTPATIENT_CLINIC_OR_DEPARTMENT_OTHER): Payer: Self-pay | Admitting: Physical Therapy

## 2022-03-19 NOTE — Progress Notes (Signed)
03/20/2022 Justin Morrow [id number] Sep 10, 1985  Referring provider: [place], Blondell Reveal, MD Primary GI doctor: Dr. Havery Moros  ASSESSMENT AND PLAN:   Rectal bleeding for 3 weeks, no rectal pain associated with it, slightly more difficult to pass stools Abnormal rectal exam with left sided mass/skin tag arising from the anus, not rectum.  (See photo before) Hard, nontender 2-3 inch cauilflower/fingerlike projection Tender rectal exam with slight stenosis  Negative FOBT in the office and in Feb Will set up for colonoscopy to rule out UC, internal hemorrhoids, masses, skin tag, condyloma Check Cbc for anemia Pending colonoscopy can refer to general surgery for biopsy/evaluation Will treat for hemorrhoids, given hand out and hydrocortisone cream Also discussed adding on B12 due to his plant based diet, if MCV elevated can add on B12  Patient Care Team: [place], Blondell Reveal, MD as PCP - General (Family Medicine)  HISTORY OF PRESENT ILLNESS: 37 y.o. male with a past medical history of hyperlipidemia, prediabetes, elevated LFTs 2022 and others listed below presents for evaluation of blood in stool.   07/31/2019 RUQ [place] for elevated LFTs unremarkable gallbladder, liver within normal limits   He has 1-2 Bm's daily in the morning.  States in Jan, increased nut milk and has been having slightly increased straining but still bristol stool 4, noticed BRB on outside of stools and TP every 2-3 days for 3-4 weeks.  Denies associated AB pain, nausea, vomiting.  Has happened before for one day or two but never this long.  No rectal itching, burning or pain.  02/18/2022 negative FOBT He follows plant based diet, was increasing nut milk at that time and less water in Jan.  Since Feb he has increased water and has not noticed as much.  He states he had has increase stress with house issues in Jan.   No NSAIDS. No tobacco use, no ETOH, no drug use.  Colon cancer maternal GM, mother passed with  melanoma 54.  One sexual male partner, wife. Has 2 kids.  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   He  reports that he has never smoked. He has never used smokeless tobacco. He reports current alcohol use of about 1.0 standard drink of alcohol per week. He reports that he does not use drugs.  RELEVANT LABS AND IMAGING: CBC    Component Value Date/Time   WBC 7.2 04/21/2021 0949   WBC 5.5 07/11/2019 0858   RBC 5.03 04/21/2021 0949   RBC 4.61 07/11/2019 0858   HGB 14.5 04/21/2021 0949   HCT 43.7 04/21/2021 0949   PLT 308 04/21/2021 0949   MCV 87 04/21/2021 0949   MCH 28.8 04/21/2021 0949   MCHC 33.2 04/21/2021 0949   MCHC 33.4 07/11/2019 0858   RDW 13.2 04/21/2021 0949   LYMPHSABS 1.9 04/21/2021 0949   MONOABS 0.4 07/11/2019 0858   EOSABS 0.2 04/21/2021 0949   BASOSABS 0.1 04/21/2021 0949   Recent Labs    04/21/21 0949  HGB 14.5     CMP     Component Value Date/Time   NA 140 04/21/2021 0949   K 4.3 04/21/2021 0949   CL 102 04/21/2021 0949   CO2 25 04/21/2021 0949   GLUCOSE 94 04/21/2021 0949   BUN 12 04/21/2021 0949   CREATININE 0.87 04/21/2021 0949   CALCIUM 10.2 04/21/2021 0949   PROT 7.4 04/21/2021 0949   ALBUMIN 5.0 04/21/2021 0949   AST 27 04/21/2021 0949   ALT  38 04/21/2021 0949   ALKPHOS 87 04/21/2021 0949   BILITOT 0.9 04/21/2021 0949   GFRNONAA 85 06/16/2019 0910   GFRAA 98 06/16/2019 0910      Latest Ref Rng & Units 04/21/2021    9:49 AM 10/25/2020    3:39 PM 03/15/2020   10:05 AM  Hepatic Function  Total Protein 6.0 - 8.5 g/dL 7.4  7.6  7.5   Albumin 4.0 - 5.0 g/dL 5.0  5.0  5.0   AST 0 - 40 IU/L '27  31  30   '$ ALT 0 - 44 IU/L 38  34  48   Alk Phosphatase 44 - 121 IU/L 87  64  90   Total Bilirubin 0.0 - 1.2 mg/dL 0.9  0.8  0.6       Current Medications:        Current Outpatient Medications (Other):    hydrocortisone (ANUSOL-HC) 2.5 % rectal cream, Place 1 Application rectally 2 (two) times daily.    Na Sulfate-K Sulfate-Mg Sulf 17.5-3.13-1.6 GM/177ML SOLN, Take 1 kit by mouth once for 1 dose.   amoxicillin-clavulanate (AUGMENTIN) 875-125 MG tablet, Take 1 tablet by mouth 2 (two) times daily. (Patient not taking: Reported on 02/17/2022)  Medical History:  Past Medical History:  Diagnosis Date   Kidney stone 2020   Allergies: No Known Allergies   Surgical History:  He  has a past surgical history that includes Spine surgery (12/29/2005) and Knee arthroscopy (1999). Family History:  His family history includes Alzheimer's disease in his maternal grandmother; Cancer in his maternal grandfather and mother; Dementia in his maternal grandmother; Diabetes in his paternal grandmother; Healthy in his brother and sister; Heart disease in his paternal grandfather; Hyperlipidemia in his father; Hypertension in his father.  REVIEW OF SYSTEMS  : All other systems reviewed and negative except where noted in the History of Present Illness.  PHYSICAL EXAM: BP 122/80   Pulse (!) 108   Ht '5\' 6"'$  (1.676 m)   Wt 137 lb (62.1 kg)   SpO2 98%   BMI 22.11 kg/m  General Appearance: Well nourished, in no apparent distress. Head:   Normocephalic and atraumatic. Eyes:  sclerae anicteric,conjunctive pink  Respiratory: Respiratory effort normal, BS equal bilaterally without rales, rhonchi, wheezing. Cardio: RRR with no MRGs. Peripheral pulses intact.  Abdomen: Soft,  Flat ,active bowel sounds. No tenderness . No masses. Rectal: left rectum with pedunculated, hard, non tender flesh colored cauliflower projection ( see photo below), posterior rectum with white/healed fissure?, mild tenderness and stenosis on rectal exam, unable to do really well, scant brown stool and negative hemoccult Musculoskeletal: Full ROM, Normal gait. Without edema. Skin:  Dry and intact without significant lesions or rashes Neuro: Alert and  oriented x4;  No focal deficits. Psych:  Cooperative. Normal mood and affect.    Vladimir Crofts, PA-C 2:46 PM

## 2022-03-20 ENCOUNTER — Encounter: Payer: Self-pay | Admitting: Physician Assistant

## 2022-03-20 ENCOUNTER — Ambulatory Visit: Payer: 59 | Admitting: Physician Assistant

## 2022-03-20 ENCOUNTER — Other Ambulatory Visit (INDEPENDENT_AMBULATORY_CARE_PROVIDER_SITE_OTHER): Payer: 59

## 2022-03-20 ENCOUNTER — Ambulatory Visit (INDEPENDENT_AMBULATORY_CARE_PROVIDER_SITE_OTHER): Payer: 59 | Admitting: Physician Assistant

## 2022-03-20 VITALS — BP 122/80 | HR 108 | Ht 66.0 in | Wt 137.0 lb

## 2022-03-20 DIAGNOSIS — R6889 Other general symptoms and signs: Secondary | ICD-10-CM | POA: Diagnosis not present

## 2022-03-20 DIAGNOSIS — K625 Hemorrhage of anus and rectum: Secondary | ICD-10-CM

## 2022-03-20 LAB — CBC WITH DIFFERENTIAL/PLATELET
Basophils Absolute: 0 10*3/uL (ref 0.0–0.1)
Basophils Relative: 0.5 % (ref 0.0–3.0)
Eosinophils Absolute: 0.2 10*3/uL (ref 0.0–0.7)
Eosinophils Relative: 2.3 % (ref 0.0–5.0)
HCT: 44.2 % (ref 39.0–52.0)
Hemoglobin: 14.9 g/dL (ref 13.0–17.0)
Lymphocytes Relative: 21.6 % (ref 12.0–46.0)
Lymphs Abs: 1.9 10*3/uL (ref 0.7–4.0)
MCHC: 33.6 g/dL (ref 30.0–36.0)
MCV: 89.7 fl (ref 78.0–100.0)
Monocytes Absolute: 0.5 10*3/uL (ref 0.1–1.0)
Monocytes Relative: 5.1 % (ref 3.0–12.0)
Neutro Abs: 6.3 10*3/uL (ref 1.4–7.7)
Neutrophils Relative %: 70.5 % (ref 43.0–77.0)
Platelets: 279 10*3/uL (ref 150.0–400.0)
RBC: 4.93 Mil/uL (ref 4.22–5.81)
RDW: 13.2 % (ref 11.5–15.5)
WBC: 9 10*3/uL (ref 4.0–10.5)

## 2022-03-20 MED ORDER — HYDROCORTISONE (PERIANAL) 2.5 % EX CREA: 1.0000 | TOPICAL_CREAM | Freq: Two times a day (BID) | CUTANEOUS | 2 refills | Status: AC

## 2022-03-20 MED ORDER — NA SULFATE-K SULFATE-MG SULF 17.5-3.13-1.6 GM/177ML PO SOLN: 1.0000 | Freq: Once | ORAL | 0 refills | Status: AC

## 2022-03-20 NOTE — Patient Instructions (Addendum)
Your provider has requested that you go to the basement level for lab work before leaving today. Press "B" on the elevator. The lab is located at the first door on the left as you exit the elevator.  Please do sitz baths- these can be found at the pharmacy. It is a English as a second language teacher that is put in your toliet.  Apply a pea size amount of over the counter Anusol HC cream to the tip of an over the counter PrepH suppository and insert rectally once every night for at least 7 nights.  If this does not improve there are procedures that can be done.   About Hemorrhoids  Hemorrhoids are swollen veins in the lower rectum and anus.  Also called piles, hemorrhoids are a common problem.  Hemorrhoids may be internal (inside the rectum) or external (around the anus).  Internal Hemorrhoids  Internal hemorrhoids are often painless, but they rarely cause bleeding.  The internal veins may stretch and fall down (prolapse) through the anus to the outside of the body.  The veins may then become irritated and painful.  External Hemorrhoids  External hemorrhoids can be easily seen or felt around the anal opening.  They are under the skin around the anus.  When the swollen veins are scratched or broken by straining, rubbing or wiping they sometimes bleed.  How Hemorrhoids Occur  Veins in the rectum and around the anus tend to swell under pressure.  Hemorrhoids can result from increased pressure in the veins of your anus or rectum.  Some sources of pressure are:  Straining to have a bowel movement because of constipation Waiting too long to have a bowel movement Coughing and sneezing often Sitting for extended periods of time, including on the toilet Diarrhea Obesity Trauma or injury to the anus Some liver diseases Stress Family history of hemorrhoids Pregnancy  Pregnant women should try to avoid becoming constipated, because they are more likely to have hemorrhoids during pregnancy.  In the last trimester  of pregnancy, the enlarged uterus may press on blood vessels and causes hemorrhoids.  In addition, the strain of childbirth sometimes causes hemorrhoids after the birth.  Symptoms of Hemorrhoids  Some symptoms of hemorrhoids include: Swelling and/or a tender lump around the anus Itching, mild burning and bleeding around the anus Painful bowel movements with or without constipation Bright red blood covering the stool, on toilet paper or in the toilet bowel.   Symptoms usually go away within a few days.  Always talk to your doctor about any bleeding to make sure it is not from some other causes.  Diagnosing and Treating Hemorrhoids  Diagnosis is made by an examination by your healthcare provider.  Special test can be performed by your doctor.    Most cases of hemorrhoids can be treated with: High-fiber diet: Eat more high-fiber foods, which help prevent constipation.  Ask for more detailed fiber information on types and sources of fiber from your healthcare provider. Fluids: Drink plenty of water.  This helps soften bowel movements so they are easier to pass. Sitz baths and cold packs: Sitting in lukewarm water two or three times a day for 15 minutes cleases the anal area and may relieve discomfort.  If the water is too hot, swelling around the anus will get worse.  Placing a cloth-covered ice pack on the anus for ten minutes four times a day can also help reduce selling.  Gently pushing a prolapsed hemorrhoid back inside after the bath or ice pack can  be helpful. Medications: For mild discomfort, your healthcare provider may suggest over-the-counter pain medication or prescribe a cream or ointment for topical use.  The cream may contain witch hazel, zinc oxide or petroleum jelly.  Medicated suppositories are also a treatment option.  Always consult your doctor before applying medications or creams. Procedures and surgeries: There are also a number of procedures and surgeries to shrink or remove  hemorrhoids in more serious cases.  Talk to your physician about these options.  You can often prevent hemorrhoids or keep them from becoming worse by maintaining a healthy lifestyle.  Eat a fiber-rich diet of fruits, vegetables and whole grains.  Also, drink plenty of water and exercise regularly.   2007, [employer] Doc.30  You have been scheduled for a colonoscopy. Please follow written instructions given to you at your visit today.  Please pick up your prep supplies at the pharmacy within the next 1-3 days. If you use inhalers (even only as needed), please bring them with you on the day of your procedure.   Thank you for choosing [facility]  [facility]

## 2022-03-21 NOTE — Progress Notes (Signed)
Agree with assessment and plan as outlined. DDx includes condyloma vs. Hypretrophied papillae / skin tag vs. Other polypoid lesion. Hard to say looking at photo - agree with colonoscopy to further evaluate.

## 2022-03-23 ENCOUNTER — Encounter: Payer: Self-pay | Admitting: Physician Assistant

## 2022-03-24 ENCOUNTER — Encounter (HOSPITAL_BASED_OUTPATIENT_CLINIC_OR_DEPARTMENT_OTHER): Payer: Self-pay | Admitting: Physical Therapy

## 2022-03-24 ENCOUNTER — Ambulatory Visit (HOSPITAL_BASED_OUTPATIENT_CLINIC_OR_DEPARTMENT_OTHER): Payer: 59 | Admitting: Physical Therapy

## 2022-03-24 DIAGNOSIS — R2689 Other abnormalities of gait and mobility: Secondary | ICD-10-CM

## 2022-03-24 DIAGNOSIS — M25551 Pain in right hip: Secondary | ICD-10-CM | POA: Diagnosis not present

## 2022-03-24 DIAGNOSIS — R262 Difficulty in walking, not elsewhere classified: Secondary | ICD-10-CM

## 2022-03-24 NOTE — Therapy (Signed)
OUTPATIENT PHYSICAL THERAPY THORACOLUMBAR EVALUATION   Patient Name: Justin Morrow MRN: [id number] DOB:1985-02-09, 37 y.o., male Today's Date: 03/24/2022  END OF SESSION:  PT End of Session - 03/24/22 1148     Visit Number 8    Number of Visits 10    Date for PT Re-Evaluation 04/03/22    Authorization Type [facility] [employer]    PT Start Time 1147    PT Stop Time 1230    PT Time Calculation (min) 43 min    Activity Tolerance Patient tolerated treatment well    Behavior During Therapy WFL for tasks assessed/performed              Past Medical History:  Diagnosis Date   Kidney stone 2020   Past Surgical History:  Procedure Laterality Date   KNEE ARTHROSCOPY  1999   SPINE SURGERY  12/29/2005   Patient Active Problem List   Diagnosis Date Noted   Blood in stool 02/17/2022   Degeneration of lumbar intervertebral disc 04/28/2021   Lumbar spondylosis 04/28/2021   Wellness examination 04/28/2021   Gross hematuria 10/25/2020   Ischial bursitis of left side 10/08/2020   Spinal stenosis at L4-L5 level 05/29/2020   Lumbosacral radiculopathy 05/23/2020   Low back pain 04/24/2020   Hip pain 04/24/2020   Strain of calf muscle 04/24/2020   Prediabetes 04/24/2020   Hyperlipidemia 04/24/2020   Transaminitis 04/24/2020    REFERRING PROVIDER: Burnard Bunting, MD  REFERRING DIAG:  M79.606 (ICD-10-CM) - Pain of lower extremity, unspecified laterality     Rationale for Evaluation and Treatment: Rehabilitation  THERAPY DIAG:  Pain in right hip  Difficulty in walking, not elsewhere classified  Other abnormalities of gait and mobility  ONSET DATE: a few weeks ago  SUBJECTIVE:                                                                                                                                                                                           SUBJECTIVE STATEMENT: Still not past the knee, hip feels overall the same but with less intensity. Left HS started feeling weak  also. Starting next week with massage therapist again.    PERTINENT HISTORY:  H/o back pain  PAIN:  Are you having pain? Yes: NPRS scale: n/a/10 Pain location: rt buttock and HS Pain description: tight Aggravating factors: standing still, bending/stretching Relieving factors: moving  PRECAUTIONS: None  WEIGHT BEARING RESTRICTIONS: No  FALLS:  Has patient fallen in last 6 months? No  OCCUPATION: psyD  PLOF: Independent  PATIENT GOALS: decrease pain, back to activity    OBJECTIVE:   POSTURE:  Anterior lean of the body and  standing when viewing laterally  2/9: Rt lateral shift of thoracic spine with decrease kyphosis, decr lumbar lordosis and slight forward flexion  2/12: 5 deg Rt shift in lumbar spine from midline of LEs, Rt deg tilt inferiorly on the right per inclinometer  PALPATION: Tightness limiting Rt LE ADD Tightness noted in bil HS and piriformis with tenderness to Rt side  3/5: no pain in HS MMT but feels weak and tension when added with IR/ER at hip        Concordant Pain in sidelying hip abd+extension at lateral gluts                   Distal pain with compression to Rt lateral L3 both at muscular level as well as joint mob- not consistent with neural input       LUMBAR ROM:   2/9- WFL with notable correction of shift in further flexion  LOWER EXTREMITY ROM:     Within functional limits  LOWER EXTREMITY MMT:    Gross 5 out of 5 hip abduction with discomfort on the right side GAIT: Distance walked: Ambulated within clinic Assistive device utilized: None Level of assistance: Complete Independence Comments: Right foot strike lateral to medial versus heel-to-toe on the left side  TODAY'S TREATMENT:                                                                                                                               Treatment                            3/12:  Trigger Point Dry Needling, Manual Therapy Treatment:  Initial or subsequent  education regarding Trigger Point Dry Needling: Subsequent Did patient give consent to treatment with Trigger Point Dry Needling: Yes TPDN with skilled palpation and monitoring followed by STM to the following muscles: bilateral glut med and piriformis Ultrasound- 3.3 MHz 100% 8 min Rt glut med Active hamstring stretch bil Child pose Alt hip/knee ext, fire hydrant in qped   Treatment                            3/5:  Trigger Point Dry Needling, Manual Therapy Treatment:  Initial or subsequent education regarding Trigger Point Dry Needling: Subsequent Did patient give consent to treatment with Trigger Point Dry Needling: Yes TPDN with skilled palpation and monitoring followed by STM to the following muscles: Rt glut med  Ultrasound- 3.3 MHz 100% 8 min Rt glut med Figure 4 stretch Lt sidelying hip abd stretch Seated AHSS Child pose Self release with tennis ball   Treatment                            2/26:  Trigger Point Dry Needling, Manual Therapy Treatment:  Initial or subsequent education regarding Trigger Point Dry Needling:  Subsequent Did patient give consent to treatment with Trigger Point Dry Needling: Yes TPDN with skilled palpation and monitoring followed by STM to the following muscles: gastroc, hamstrings semitendinosus, piriformis all on Rt  Self correction for Rt upslip 5 min hold   Treatment                            02/23/22:  Trialed shift correction at wall but did not tolerate well Trigger Point Dry Needling, Manual Therapy Treatment:  Initial or subsequent education regarding Trigger Point Dry Needling: Subsequent Did patient give consent to treatment with Trigger Point Dry Needling: Yes TPDN with skilled palpation and monitoring followed by STM to the following muscles: Rt T11-L5 paraspinals  Qped to child pose rocking with hands to L to stretch Rt side Prone PA spring Rt upper quadrant Bil sciatic nerve glides Small rage LTR Therace- discussed child  carrying posture   PATIENT EDUCATION:  Education details: Anatomy of condition, POC, HEP, exercise form/rationale Person educated: Patient Education method: Explanation, Demonstration, Tactile cues, Verbal cues, and Handouts Education comprehension: verbalized understanding, returned demonstration, verbal cues required, tactile cues required, and needs further education  HOME EXERCISE PROGRAM: Twice daily: Seated hamstring stretch seated piriformis stretch standing gastroc stretch [id number]  ASSESSMENT:  CLINICAL IMPRESSION: Continued with DN and [place] today as he did experience improvements after last appt. Gluts are weak due to poor length-tension relationship with HS creating poor mechanical patterns. Will continue to progress HS flexibility which currently does not extend past 90/90 in supine and address glut strength with proper form.   OBJECTIVE IMPAIRMENTS: Abnormal gait, decreased activity tolerance, impaired flexibility, improper body mechanics, and pain.   ACTIVITY LIMITATIONS: standing, squatting, and runnin  PARTICIPATION LIMITATIONS: community activity  PERSONAL FACTORS: 1 comorbidity: h/o back pain  are also affecting patient's functional outcome.   REHAB POTENTIAL: Good  CLINICAL DECISION MAKING: Stable/uncomplicated  EVALUATION COMPLEXITY: Low   GOALS: Goals reviewed with patient? Yes  LONG TERM GOALS: Target date: POC date  Pt will demo proper form in long term stretching program Baseline:  Goal status: achieved  2.  Able to jog without distal symptoms Baseline: has not jogged Goal status: not measured  3.  Resolution of HS pain and excessive tightness Baseline: tightness is on and off, primarily in the AM Goal status: ongoing  4.  Demo upright , neutral posture Baseline:  Goal status: INITIAL 5.  Able to stand from charis without sharp pain in Rt proximal HS/gluts consistently for at least 3 days in a row Baseline:  Goal status:  INITIAL     PLAN:  PT FREQUENCY: 1x/week  PT DURATION: 4 weeks  PLANNED INTERVENTIONS: Therapeutic exercises, Therapeutic activity, Neuromuscular re-education, Balance training, Gait training, Patient/Family education, Self Care, Joint mobilization, Joint manipulation, Stair training, Aquatic Therapy, Dry Needling, Electrical stimulation, Spinal mobilization, Cryotherapy, Moist heat, Taping, Ionotophoresis '4mg'$ /ml Dexamethasone, and Manual therapy.  PLAN FOR NEXT SESSION: DN PRN, hip abd strength progressions, continue hip hinge, outcome of DN?    Lior Hoen C. Ishmael Berkovich PT, DPT 03/24/22 12:53 PM

## 2022-03-25 ENCOUNTER — Encounter: Payer: 59 | Admitting: Gastroenterology

## 2022-03-26 NOTE — Progress Notes (Signed)
Benito Mccreedy D.[place] Phone: [phone]   Assessment and Plan:     1. Muscle strain of gluteal region, right, initial encounter -Subacute, unchanged, initial sports medicine visit - Most consistent with gluteal muscle strain, suspect glue minimus versus glue medius versus piriformis or combination.  Likely occurred during exertion with PT - Recommend continued physical therapy and HEP targeting gluteal musculature as well as hamstring stretching - Start meloxicam 15 mg daily x2 weeks.  If still having pain after 2 weeks, complete 3rd-week of meloxicam. May use remaining meloxicam as needed once daily for pain control.  Do not to use additional NSAIDs while taking meloxicam.  May use Tylenol [phone] mg 2 to 3 times a day for breakthrough pain. - With past medical history of lumbar degeneration and spondylosis, recommend physical activity such as stationary bike, elliptical, water aerobics  Other orders - meloxicam (MOBIC) 15 MG tablet; Take 1 tablet (15 mg total) by mouth daily.    Pertinent previous records reviewed include none   Follow Up: 3 to 4 weeks for reevaluation.  Could consider OMT versus ultrasound versus x-ray if no improvement or worsening of symptoms   Subjective:   I, Justin Morrow, am serving as a Education administrator for Doctor Glennon Mac  Chief Complaint: back and hip pain  HPI:   04/02/2022 Patient is a 37 year old male complaining of back and hip pain. Patient states that he is a running this fall hamstring pain ,went to PT dry needled and it helped some, 02/20/2022 PT put him in a prone superman tilt with a 4 lbs weight around his ankle, that's when the hip pain started, Pt now thinks he has a hip issue, massage therapist told him that he is tight from the ql down , he can feel a pulling from his hamstrings down , he was told he was tilted. Intermittent tingling down into the foot, pain  radiates down to the knee, no meds for the pain , pain first thing in the morning but as the day gets going it loosens up , laying on his right side he can get some relief, movement and stretching helps    Relevant Historical Information: Spinal stenosis at L4-5  Additional pertinent review of systems negative.   Current Outpatient Medications:    amoxicillin-clavulanate (AUGMENTIN) 875-125 MG tablet, Take 1 tablet by mouth 2 (two) times daily., Disp: 20 tablet, Rfl: 0   hydrocortisone (ANUSOL-HC) 2.5 % rectal cream, Place 1 Application rectally 2 (two) times daily., Disp: 30 g, Rfl: 2   meloxicam (MOBIC) 15 MG tablet, Take 1 tablet (15 mg total) by mouth daily., Disp: 30 tablet, Rfl: 0   Objective:     Vitals:   04/02/22 1520  BP: 120/80  Pulse: 79  SpO2: 97%  Weight: 138 lb (62.6 kg)  Height: 5\' 6"  (1.676 m)      Body mass index is 22.27 kg/m.    Physical Exam:    General: awake, alert, and oriented no acute distress, nontoxic Skin: no suspicious lesions or rashes Neuro:sensation intact distally with no deficits, normal muscle tone, no atrophy, strength 5/5 in all tested lower ext groups Psych: normal mood and affect, speech clear   Right hip: No deformity, swelling or wasting ROM Flexion 90, ext 30, IR 45, ER 45 TTP gluteal musculature NTTP over the hip flexors, greater trochanter, , si joint, lumbar spine Negative log roll with FROM Negative FABER Negative  FADIR Negative Piriformis test for pain, but positive for increased tightness on right compared to left Negative trendelenberg Gait normal  Pain in gluteal region with resisted hip abduction and external rotation  Electronically signed by:  Benito Mccreedy D.[facility] 8:09 AM 04/03/22

## 2022-03-30 ENCOUNTER — Ambulatory Visit (HOSPITAL_BASED_OUTPATIENT_CLINIC_OR_DEPARTMENT_OTHER): Payer: 59 | Admitting: Physical Therapy

## 2022-03-30 ENCOUNTER — Encounter (HOSPITAL_BASED_OUTPATIENT_CLINIC_OR_DEPARTMENT_OTHER): Payer: Self-pay | Admitting: Physical Therapy

## 2022-03-30 DIAGNOSIS — R262 Difficulty in walking, not elsewhere classified: Secondary | ICD-10-CM | POA: Diagnosis not present

## 2022-03-30 DIAGNOSIS — M25551 Pain in right hip: Secondary | ICD-10-CM

## 2022-03-30 DIAGNOSIS — R2689 Other abnormalities of gait and mobility: Secondary | ICD-10-CM | POA: Diagnosis not present

## 2022-03-30 NOTE — Therapy (Signed)
OUTPATIENT PHYSICAL THERAPY THORACOLUMBAR EVALUATION   Patient Name: Justin Morrow MRN: [id number] DOB:1985-09-12, 37 y.o., male Today's Date: 03/30/2022  END OF SESSION:  PT End of Session - 03/30/22 1604     Visit Number 9    Number of Visits 10    Date for PT Re-Evaluation 04/03/22    Authorization Type [facility] [employer]    PT Start Time 1603    PT Stop Time 1645    PT Time Calculation (min) 42 min    Activity Tolerance Patient tolerated treatment well    Behavior During Therapy [facility] for tasks assessed/performed               Past Medical History:  Diagnosis Date   Kidney stone 2020   Past Surgical History:  Procedure Laterality Date   KNEE ARTHROSCOPY  1999   SPINE SURGERY  12/29/2005   Patient Active Problem List   Diagnosis Date Noted   Blood in stool 02/17/2022   Degeneration of lumbar intervertebral disc 04/28/2021   Lumbar spondylosis 04/28/2021   Wellness examination 04/28/2021   Gross hematuria 10/25/2020   Ischial bursitis of left side 10/08/2020   Spinal stenosis at L4-L5 level 05/29/2020   Lumbosacral radiculopathy 05/23/2020   Low back pain 04/24/2020   Hip pain 04/24/2020   Strain of calf muscle 04/24/2020   Prediabetes 04/24/2020   Hyperlipidemia 04/24/2020   Transaminitis 04/24/2020    REFERRING PROVIDER: Burnard Bunting, MD  REFERRING DIAG:  M79.606 (ICD-10-CM) - Pain of lower extremity, unspecified laterality     Rationale for Evaluation and Treatment: Rehabilitation  THERAPY DIAG:  Pain in right hip  Difficulty in walking, not elsewhere classified  Other abnormalities of gait and mobility  ONSET DATE: a few weeks ago  SUBJECTIVE:                                                                                                                                                                                           SUBJECTIVE STATEMENT: Laying on Rt side, everything feels great.    PERTINENT HISTORY:  H/o back pain  PAIN:  Are you  having pain? Yes: NPRS scale: n/a/10 Pain location: rt buttock and HS Pain description: tight Aggravating factors: standing still, bending/stretching Relieving factors: moving  PRECAUTIONS: None  WEIGHT BEARING RESTRICTIONS: No  FALLS:  Has patient fallen in last 6 months? No  OCCUPATION: psyD  PLOF: Independent  PATIENT GOALS: decrease pain, back to activity    OBJECTIVE:   POSTURE:  Anterior lean of the body and standing when viewing laterally  2/9: Rt lateral shift of thoracic spine with decrease kyphosis, decr lumbar lordosis and  slight forward flexion  2/12: 5 deg Rt shift in lumbar spine from midline of LEs, Rt deg tilt inferiorly on the right per inclinometer  03/30/22 +Thomas test bilaterally, worse on Rt  PALPATION: Tightness limiting Rt LE ADD Tightness noted in bil HS and piriformis with tenderness to Rt side  3/5: no pain in HS MMT but feels weak and tension when added with IR/ER at hip        Concordant Pain in sidelying hip abd+extension at lateral gluts                   Distal pain with compression to Rt lateral L3 both at muscular level as well as joint mob- not consistent with neural input       LUMBAR ROM:   2/9- WFL with notable correction of shift in further flexion  LOWER EXTREMITY ROM:     Within functional limits  LOWER EXTREMITY MMT:    Gross 5 out of 5 hip abduction with discomfort on the right side GAIT: Distance walked: Ambulated within clinic Assistive device utilized: None Level of assistance: Complete Independence Comments: Right foot strike lateral to medial versus heel-to-toe on the left side  TODAY'S TREATMENT:                                                                                                                               Treatment                            03/30/22:  Trigger Point Dry Needling, Manual Therapy Treatment:  Initial or subsequent education regarding Trigger Point Dry Needling: Subsequent Did  patient give consent to treatment with Trigger Point Dry Needling: Yes TPDN with skilled palpation and monitoring followed by STM to the following muscles: Rt paraspinals L1-L5  Rt sidelying Lt to right lumbar glides  Thomas test stretch bil Right sidelying clams Qped hip ext Tall kneeling upright with glut set- heels of hands pressing to bil SIJ.    Treatment                            3/12:  Trigger Point Dry Needling, Manual Therapy Treatment:  Initial or subsequent education regarding Trigger Point Dry Needling: Subsequent Did patient give consent to treatment with Trigger Point Dry Needling: Yes TPDN with skilled palpation and monitoring followed by STM to the following muscles: bilateral glut med and piriformis Ultrasound- 3.3 MHz 100% 8 min Rt glut med Active hamstring stretch bil Child pose Alt hip/knee ext, fire hydrant in qped   Treatment                            3/5:  Trigger Point Dry Needling, Manual Therapy Treatment:  Initial or subsequent education regarding Trigger Point Dry Needling: Subsequent Did patient give consent  to treatment with Trigger Point Dry Needling: Yes TPDN with skilled palpation and monitoring followed by STM to the following muscles: Rt glut med  Ultrasound- 3.3 MHz 100% 8 min Rt glut med Figure 4 stretch Lt sidelying hip abd stretch Seated AHSS Child pose Self release with tennis ball   PATIENT EDUCATION:  Education details: Anatomy of condition, POC, HEP, exercise form/rationale Person educated: Patient Education method: Explanation, Demonstration, Tactile cues, Verbal cues, and Handouts Education comprehension: verbalized understanding, returned demonstration, verbal cues required, tactile cues required, and needs further education  HOME EXERCISE PROGRAM: Twice daily: Seated hamstring stretch seated piriformis stretch standing gastroc stretch [id number]  ASSESSMENT:  CLINICAL IMPRESSION: Improved symptoms with DN to Rt paraspinals  and notable tightness in hip flexors result in over-reliance of lumbar paraspinals for upright posture. Standing Lt sidebend does not result in same release as Rt Sidelying.   OBJECTIVE IMPAIRMENTS: Abnormal gait, decreased activity tolerance, impaired flexibility, improper body mechanics, and pain.   ACTIVITY LIMITATIONS: standing, squatting, and runnin  PARTICIPATION LIMITATIONS: community activity  PERSONAL FACTORS: 1 comorbidity: h/o back pain  are also affecting patient's functional outcome.   REHAB POTENTIAL: Good  CLINICAL DECISION MAKING: Stable/uncomplicated  EVALUATION COMPLEXITY: Low   GOALS: Goals reviewed with patient? Yes  LONG TERM GOALS: Target date: POC date  Pt will demo proper form in long term stretching program Baseline:  Goal status: achieved  2.  Able to jog without distal symptoms Baseline: has not jogged Goal status: not measured  3.  Resolution of HS pain and excessive tightness Baseline: tightness is on and off, primarily in the AM Goal status: ongoing  4.  Demo upright , neutral posture Baseline:  Goal status: INITIAL 5.  Able to stand from charis without sharp pain in Rt proximal HS/gluts consistently for at least 3 days in a row Baseline:  Goal status: INITIAL     PLAN:  PT FREQUENCY: 1x/week  PT DURATION: 4 weeks  PLANNED INTERVENTIONS: Therapeutic exercises, Therapeutic activity, Neuromuscular re-education, Balance training, Gait training, Patient/Family education, Self Care, Joint mobilization, Joint manipulation, Stair training, Aquatic Therapy, Dry Needling, Electrical stimulation, Spinal mobilization, Cryotherapy, Moist heat, Taping, Ionotophoresis 4mg /ml Dexamethasone, and Manual therapy.  PLAN FOR NEXT SESSION: DN PRN, hip abd strength progressions, continue hip hinge, outcome of DN?    Lyndel Sarate C. Dea Bitting PT, DPT 03/30/22 4:55 PM

## 2022-04-02 ENCOUNTER — Ambulatory Visit (INDEPENDENT_AMBULATORY_CARE_PROVIDER_SITE_OTHER): Payer: 59 | Admitting: Sports Medicine

## 2022-04-02 VITALS — BP 120/80 | HR 79 | Ht 66.0 in | Wt 138.0 lb

## 2022-04-02 DIAGNOSIS — S76011A Strain of muscle, fascia and tendon of right hip, initial encounter: Secondary | ICD-10-CM

## 2022-04-02 MED ORDER — MELOXICAM 15 MG PO TABS: 15.0000 mg | ORAL_TABLET | Freq: Every day | ORAL | 0 refills | Status: AC

## 2022-04-02 NOTE — Patient Instructions (Signed)
-   Start meloxicam 15 mg daily x2 weeks.  If still having pain after 2 weeks, complete 3rd-week of meloxicam. May use remaining meloxicam as needed once daily for pain control.  Do not to use additional NSAIDs while taking meloxicam.  May use Tylenol [phone] mg 2 to 3 times a day for breakthrough pain. Glute HEP  Continue HEP  Stationary bike,elliptical, water aerobics 3-4 week follow up

## 2022-04-05 NOTE — Therapy (Signed)
OUTPATIENT PHYSICAL THERAPY THORACOLUMBAR EVALUATION   Patient Name: Justin Morrow MRN: [id number] DOB:[birth year], 37 y.o., male Today's Date: 04/06/2022  END OF SESSION:  PT End of Session - 04/06/22 1601     Visit Number 10    Date for PT Re-Evaluation 06/12/22    Authorization Type [facility] [employer]    PT Start Time 1600    PT Stop Time 1642    PT Time Calculation (min) 42 min    Activity Tolerance Patient tolerated treatment well    Behavior During Therapy WFL for tasks assessed/performed                Past Medical History:  Diagnosis Date   Kidney stone 2020   Past Surgical History:  Procedure Laterality Date   KNEE ARTHROSCOPY  1999   SPINE SURGERY  12/29/2005   Patient Active Problem List   Diagnosis Date Noted   Blood in stool 02/17/2022   Degeneration of lumbar intervertebral disc 04/28/2021   Lumbar spondylosis 04/28/2021   Wellness examination 04/28/2021   Gross hematuria 10/25/2020   Ischial bursitis of left side 10/08/2020   Spinal stenosis at L4-L5 level 05/29/2020   Lumbosacral radiculopathy 05/23/2020   Low back pain 04/24/2020   Hip pain 04/24/2020   Strain of calf muscle 04/24/2020   Prediabetes 04/24/2020   Hyperlipidemia 04/24/2020   Transaminitis 04/24/2020    REFERRING PROVIDER: Burnard Bunting, MD  REFERRING DIAG:  M79.606 (ICD-10-CM) - Pain of lower extremity, unspecified laterality     Rationale for Evaluation and Treatment: Rehabilitation  THERAPY DIAG:  Pain in right hip  Difficulty in walking, not elsewhere classified  Other abnormalities of gait and mobility  ONSET DATE: a few weeks ago  SUBJECTIVE:                                                                                                                                                                                           SUBJECTIVE STATEMENT: Tight in bil gluts & HS.    PERTINENT HISTORY:  H/o back pain  PAIN:  Are you having pain? Yes: NPRS scale:  n/a/10 Pain location: rt buttock and HS Pain description: tight Aggravating factors: standing still, bending/stretching Relieving factors: moving  PRECAUTIONS: None  WEIGHT BEARING RESTRICTIONS: No  FALLS:  Has patient fallen in last 6 months? No  OCCUPATION: psyD  PLOF: Independent  PATIENT GOALS: decrease pain, back to activity    OBJECTIVE:   POSTURE:  Anterior lean of the body and standing when viewing laterally  2/9: Rt lateral shift of thoracic spine with decrease kyphosis, decr lumbar lordosis and slight forward flexion  2/12: 5 deg  Rt shift in lumbar spine from midline of LEs, Rt deg tilt inferiorly on the right per inclinometer  03/30/22 +Thomas test bilaterally, worse on Rt  3/25: standing at 5 deg trunk flexion with bend at hips  PALPATION: Tightness limiting Rt LE ADD Tightness noted in bil HS and piriformis with tenderness to Rt side  3/5: no pain in HS MMT but feels weak and tension when added with IR/ER at hip        Concordant Pain in sidelying hip abd+extension at lateral gluts                   Distal pain with compression to Rt lateral L3 both at muscular level as well as joint mob- not consistent with neural input       LUMBAR ROM:   2/9- WFL with notable correction of shift in further flexion  LOWER EXTREMITY ROM:     Within functional limits  LOWER EXTREMITY MMT:    Gross 5 out of 5 hip abduction with discomfort on the right side GAIT: Distance walked: Ambulated within clinic Assistive device utilized: None Level of assistance: Complete Independence Comments: Right foot strike lateral to medial versus heel-to-toe on the left side  TODAY'S TREATMENT:                                                                                                                               Treatment                            04/06/22:  Trigger Point Dry Needling, Manual Therapy Treatment:  Initial or subsequent education regarding Trigger Point Dry  Needling: Subsequent Did patient give consent to treatment with Trigger Point Dry Needling: Yes TPDN with skilled palpation and monitoring followed by STM to the following muscles: bil HS in prone  STM to bil HS following DN Active Hamstring stretch Mod thomas stretch   Treatment                            03/30/22:  Trigger Point Dry Needling, Manual Therapy Treatment:  Initial or subsequent education regarding Trigger Point Dry Needling: Subsequent Did patient give consent to treatment with Trigger Point Dry Needling: Yes TPDN with skilled palpation and monitoring followed by STM to the following muscles: Rt paraspinals L1-L5  Rt sidelying Lt to right lumbar glides  Thomas test stretch bil Right sidelying clams Qped hip ext Tall kneeling upright with glut set- heels of hands pressing to bil SIJ.    Treatment                            3/12:  Trigger Point Dry Needling, Manual Therapy Treatment:  Initial or subsequent education regarding Trigger Point Dry Needling: Subsequent Did patient give consent to treatment with Trigger Point  Dry Needling: Yes TPDN with skilled palpation and monitoring followed by STM to the following muscles: bilateral glut med and piriformis Ultrasound- 3.3 MHz 100% 8 min Rt glut med Active hamstring stretch bil Child pose Alt hip/knee ext, fire hydrant in qped   PATIENT EDUCATION:  Education details: Anatomy of condition, POC, HEP, exercise form/rationale Person educated: Patient Education method: Explanation, Demonstration, Tactile cues, Verbal cues, and Handouts Education comprehension: verbalized understanding, returned demonstration, verbal cues required, tactile cues required, and needs further education  HOME EXERCISE PROGRAM: Twice daily: Seated hamstring stretch seated piriformis stretch standing gastroc stretch [id number]  ASSESSMENT:  CLINICAL IMPRESSION: Slightly flexed posture with lack of hip ext in gait from bil LE are suspected to  be causing bil HS spasm. Extending POC to continue addressing deficits and will extend through end of May as scheduling will depend on schedule of new job.   OBJECTIVE IMPAIRMENTS: Abnormal gait, decreased activity tolerance, impaired flexibility, improper body mechanics, and pain.   ACTIVITY LIMITATIONS: standing, squatting, and runnin  PARTICIPATION LIMITATIONS: community activity  PERSONAL FACTORS: 1 comorbidity: h/o back pain  are also affecting patient's functional outcome.   REHAB POTENTIAL: Good  CLINICAL DECISION MAKING: Stable/uncomplicated  EVALUATION COMPLEXITY: Low   GOALS: Goals reviewed with patient? Yes  LONG TERM GOALS: Target date: POC date  Pt will demo proper form in long term stretching program Baseline:  Goal status: achieved  2.  Able to jog without distal symptoms Baseline: has not jogged Goal status: not measured  3.  Resolution of HS pain and excessive tightness Baseline: tightness is on and off, primarily in the AM Goal status: ongoing  4.  Demo upright , neutral posture Baseline: slighlty flexed posture through pelvis Goal status: ongoing 5.  Able to stand from charis without sharp pain in Rt proximal HS/gluts consistently for at least 3 days in a row Baseline: less frequent Goal status: ongoing. 6.  Able to lay on either side at night Baseline:  Goal status: INITIAL      PLAN:  PT FREQUENCY: 1x/week  PT DURATION: 6 weeks   PLANNED INTERVENTIONS: Therapeutic exercises, Therapeutic activity, Neuromuscular re-education, Balance training, Gait training, Patient/Family education, Self Care, Joint mobilization, Joint manipulation, Stair training, Aquatic Therapy, Dry Needling, Electrical stimulation, Spinal mobilization, Cryotherapy, Moist heat, Taping, Ionotophoresis 4mg /ml Dexamethasone, and Manual therapy.  PLAN FOR NEXT SESSION: DN PRN, hip abd strength progressions, continue hip hinge, outcome of DN?    Ercell Razon C. Shirlette Scarber PT,  DPT 04/06/22 4:49 PM

## 2022-04-06 ENCOUNTER — Ambulatory Visit (HOSPITAL_BASED_OUTPATIENT_CLINIC_OR_DEPARTMENT_OTHER): Payer: 59 | Admitting: Physical Therapy

## 2022-04-06 ENCOUNTER — Encounter (HOSPITAL_BASED_OUTPATIENT_CLINIC_OR_DEPARTMENT_OTHER): Payer: Self-pay | Admitting: Physical Therapy

## 2022-04-06 DIAGNOSIS — R262 Difficulty in walking, not elsewhere classified: Secondary | ICD-10-CM

## 2022-04-06 DIAGNOSIS — M25551 Pain in right hip: Secondary | ICD-10-CM | POA: Diagnosis not present

## 2022-04-06 DIAGNOSIS — R2689 Other abnormalities of gait and mobility: Secondary | ICD-10-CM | POA: Diagnosis not present

## 2022-04-16 ENCOUNTER — Encounter: Payer: Self-pay | Admitting: Gastroenterology

## 2022-04-20 DIAGNOSIS — R310 Gross hematuria: Secondary | ICD-10-CM | POA: Diagnosis not present

## 2022-04-20 DIAGNOSIS — R319 Hematuria, unspecified: Secondary | ICD-10-CM | POA: Diagnosis not present

## 2022-04-21 ENCOUNTER — Telehealth: Payer: Self-pay | Admitting: Physician Assistant

## 2022-04-21 DIAGNOSIS — D225 Melanocytic nevi of trunk: Secondary | ICD-10-CM | POA: Diagnosis not present

## 2022-04-21 DIAGNOSIS — D2271 Melanocytic nevi of right lower limb, including hip: Secondary | ICD-10-CM | POA: Diagnosis not present

## 2022-04-21 DIAGNOSIS — D485 Neoplasm of uncertain behavior of skin: Secondary | ICD-10-CM | POA: Diagnosis not present

## 2022-04-21 DIAGNOSIS — L723 Sebaceous cyst: Secondary | ICD-10-CM | POA: Diagnosis not present

## 2022-04-21 DIAGNOSIS — B351 Tinea unguium: Secondary | ICD-10-CM | POA: Diagnosis not present

## 2022-04-21 DIAGNOSIS — D2262 Melanocytic nevi of left upper limb, including shoulder: Secondary | ICD-10-CM | POA: Diagnosis not present

## 2022-04-21 DIAGNOSIS — D2272 Melanocytic nevi of left lower limb, including hip: Secondary | ICD-10-CM | POA: Diagnosis not present

## 2022-04-21 DIAGNOSIS — D2261 Melanocytic nevi of right upper limb, including shoulder: Secondary | ICD-10-CM | POA: Diagnosis not present

## 2022-04-21 NOTE — Telephone Encounter (Signed)
Inbound call from patient requesting to speak with a nurse in regards instructions for upcoming procedure .please advise

## 2022-04-21 NOTE — Telephone Encounter (Signed)
Pt had questions regarding the foods to stay away from the 5 days prior to procedure. Reviewed list with pt and questions were answered.

## 2022-04-22 ENCOUNTER — Encounter (HOSPITAL_BASED_OUTPATIENT_CLINIC_OR_DEPARTMENT_OTHER): Payer: 59 | Admitting: Physical Therapy

## 2022-04-22 NOTE — Progress Notes (Unsigned)
    Aleen Sells D.[facility] [address] [place] [address] Phone: [phone]   Assessment and Plan:     There are no diagnoses linked to this encounter.  ***   Pertinent previous records reviewed include ***   Follow Up: ***     Subjective:   I, Daneka Lantigua, am serving as a Neurosurgeon for Doctor Richardean Sale   Chief Complaint: back and hip pain   HPI:    04/02/2022 Patient is a 37 year old male complaining of back and hip pain. Patient states that he is a running this fall hamstring pain ,went to PT dry needled and it helped some, 02/20/2022 PT put him in a prone superman tilt with a 4 lbs weight around his ankle, that's when the hip pain started, Pt now thinks he has a hip issue, massage therapist told him that he is tight from the ql down , he can feel a pulling from his hamstrings down , he was told he was tilted. Intermittent tingling down into the foot, pain radiates down to the knee, no meds for the pain , pain first thing in the morning but as the day gets going it loosens up , laying on his right side he can get some relief, movement and stretching helps    04/23/2022 Patient states    Relevant Historical Information: Spinal stenosis at L4-5    Additional pertinent review of systems negative.   Current Outpatient Medications:    amoxicillin-clavulanate (AUGMENTIN) 875-125 MG tablet, Take 1 tablet by mouth 2 (two) times daily., Disp: 20 tablet, Rfl: 0   hydrocortisone (ANUSOL-HC) 2.5 % rectal cream, Place 1 Application rectally 2 (two) times daily., Disp: 30 g, Rfl: 2   meloxicam (MOBIC) 15 MG tablet, Take 1 tablet (15 mg total) by mouth daily., Disp: 30 tablet, Rfl: 0   Objective:     There were no vitals filed for this visit.    There is no height or weight on file to calculate BMI.    Physical Exam:    ***   Electronically signed by:  Aleen Sells [facility] 7:31 AM 04/22/22

## 2022-04-23 ENCOUNTER — Encounter: Payer: Self-pay | Admitting: Sports Medicine

## 2022-04-23 ENCOUNTER — Ambulatory Visit (INDEPENDENT_AMBULATORY_CARE_PROVIDER_SITE_OTHER): Payer: 59

## 2022-04-23 ENCOUNTER — Ambulatory Visit (INDEPENDENT_AMBULATORY_CARE_PROVIDER_SITE_OTHER): Payer: 59 | Admitting: Sports Medicine

## 2022-04-23 VITALS — BP 110/64 | HR 92 | Ht 66.0 in | Wt 138.0 lb

## 2022-04-23 DIAGNOSIS — M9903 Segmental and somatic dysfunction of lumbar region: Secondary | ICD-10-CM | POA: Diagnosis not present

## 2022-04-23 DIAGNOSIS — M9904 Segmental and somatic dysfunction of sacral region: Secondary | ICD-10-CM | POA: Diagnosis not present

## 2022-04-23 DIAGNOSIS — M9905 Segmental and somatic dysfunction of pelvic region: Secondary | ICD-10-CM

## 2022-04-23 DIAGNOSIS — M545 Low back pain, unspecified: Secondary | ICD-10-CM | POA: Diagnosis not present

## 2022-04-23 DIAGNOSIS — S76011D Strain of muscle, fascia and tendon of right hip, subsequent encounter: Secondary | ICD-10-CM

## 2022-04-23 NOTE — Patient Instructions (Signed)
Thank you for coming in today.  Please get an Xray today before you leave  Continue current home exercise program.

## 2022-04-23 NOTE — Telephone Encounter (Signed)
Forwarding to Dr. Jean Rosenthal to advise.

## 2022-04-26 ENCOUNTER — Encounter: Payer: Self-pay | Admitting: Certified Registered Nurse Anesthetist

## 2022-04-27 ENCOUNTER — Ambulatory Visit (AMBULATORY_SURGERY_CENTER): Payer: 59 | Admitting: Gastroenterology

## 2022-04-27 ENCOUNTER — Encounter: Payer: Self-pay | Admitting: Gastroenterology

## 2022-04-27 VITALS — BP 112/63 | HR 64 | Temp 99.6°F | Resp 12 | Ht 66.0 in | Wt 137.0 lb

## 2022-04-27 DIAGNOSIS — D123 Benign neoplasm of transverse colon: Secondary | ICD-10-CM

## 2022-04-27 DIAGNOSIS — R6889 Other general symptoms and signs: Secondary | ICD-10-CM | POA: Diagnosis not present

## 2022-04-27 DIAGNOSIS — K625 Hemorrhage of anus and rectum: Secondary | ICD-10-CM | POA: Diagnosis not present

## 2022-04-27 DIAGNOSIS — K635 Polyp of colon: Secondary | ICD-10-CM | POA: Diagnosis not present

## 2022-04-27 MED ORDER — SODIUM CHLORIDE 0.9 % IV SOLN
500.0000 mL | Freq: Once | INTRAVENOUS | Status: DC
Start: 2022-04-27 — End: 2022-04-27

## 2022-04-27 NOTE — Progress Notes (Signed)
Report given to PACU, vss 

## 2022-04-27 NOTE — Progress Notes (Signed)
Called to room to assist during endoscopic procedure.  Patient ID and intended procedure confirmed with present staff. Received instructions for my participation in the procedure from the performing physician.  

## 2022-04-27 NOTE — Progress Notes (Signed)
[facility] History and Physical   Primary Care Physician:  [place], Justin Kos, MD   Reason for Procedure:   Rectal bleeding, abnormal DRE  Plan:    colonoscopy     HPI: Justin Morrow is a 37 y.o. male  here for colonoscopy to evaluate bleeding symptoms and abnormal DRE. No changes since office visit 03/20/22. Father had UC.   No family history of colon cancer known. Otherwise feels well without any cardiopulmonary symptoms.   I have discussed risks / benefits of anesthesia and endoscopic procedure with Justin Morrow and they wish to proceed with the exams as outlined today.    Past Medical History:  Diagnosis Date   Kidney stone 2020    Past Surgical History:  Procedure Laterality Date   KNEE ARTHROSCOPY  1999   SPINE SURGERY  12/29/2005    Prior to Admission medications   Medication Sig Start Date End Date Taking? Authorizing Provider  hydrocortisone (ANUSOL-HC) 2.5 % rectal cream Place 1 Application rectally 2 (two) times daily. 03/20/22   Justin Albee, PA-C  meloxicam (MOBIC) 15 MG tablet Take 1 tablet (15 mg total) by mouth daily. 04/02/22   Justin Sale, DO    Current Outpatient Medications  Medication Sig Dispense Refill   hydrocortisone (ANUSOL-HC) 2.5 % rectal cream Place 1 Application rectally 2 (two) times daily. 30 g 2   meloxicam (MOBIC) 15 MG tablet Take 1 tablet (15 mg total) by mouth daily. 30 tablet 0   Current Facility-Administered Medications  Medication Dose Route Frequency Provider Last Rate Last Admin   0.9 %  sodium chloride infusion  500 mL Intravenous Once Justin Morrow, Justin Rayas, MD        Allergies as of 04/27/2022   (No Known Allergies)    Family History  Problem Relation Age of Onset   Cancer Mother    Hyperlipidemia Father    Hypertension Father    Ulcerative colitis Father    Healthy Sister    Healthy Brother    Dementia Maternal Grandmother    Alzheimer's disease Maternal Grandmother    Cancer Maternal  Grandfather    Diabetes Paternal Grandmother    Heart disease Paternal Grandfather    Pancreatic cancer Neg Hx    Liver disease Neg Hx     Social History   Socioeconomic History   Marital status: Married    Spouse name: Not on file   Number of children: 2   Years of education: Not on file   Highest education level: Not on file  Occupational History   Not on file  Tobacco Use   Smoking status: Never   Smokeless tobacco: Never  Vaping Use   Vaping Use: Never used  Substance and Sexual Activity   Alcohol use: Yes    Alcohol/week: 1.0 standard drink of alcohol    Types: 1 Glasses of wine per week    Comment: rarely, every 2-3 months   Drug use: Never   Sexual activity: Yes  Other Topics Concern   Not on file  Social History Narrative   Not on file   Social Determinants of Health   Financial Resource Strain: Not on file  Food Insecurity: Not on file  Transportation Needs: Not on file  Physical Activity: Not on file  Stress: Not on file  Social Connections: Not on file  Intimate Partner Violence: Not on file    Review of Systems: All other review of systems negative except as mentioned in the HPI.  Physical  Exam: Vital signs BP 134/69   Pulse 77   Temp 99.6 F (37.6 C)   Ht  (1.676 m)   Wt 137 lb (62.1 kg)   SpO2 99%   BMI 22.11 kg/m   General:   Alert,  Well-developed, pleasant and cooperative in NAD Lungs:  Clear throughout to auscultation.   Heart:  Regular rate and rhythm Abdomen:  Soft, nontender and nondistended.   Neuro/Psych:  Alert and cooperative. Normal mood and affect. A and O x 3  Harlin Rain, MD [facility]

## 2022-04-27 NOTE — Progress Notes (Signed)
Pt's states no medical or surgical changes since previsit or office visit. 

## 2022-04-27 NOTE — Op Note (Signed)
[facility] Patient Name: Justin Morrow Procedure Date: 04/27/2022 3:07 PM MRN: [id number] Endoscopist: Viviann Spare P. Adela Lank , MD, [id number] Age: 37 Referring MD:  Date of Birth: 1985/05/22 Gender: Male Account #: [id number] Procedure:                Colonoscopy Indications:              Rectal bleeding, abnormal DRE - perianal lesion,                            father with history of IBD Medicines:                Monitored Anesthesia Care Procedure:                Pre-Anesthesia Assessment:                           - Prior to the procedure, a History and Physical                            was performed, and patient medications and                            allergies were reviewed. The patient's tolerance of                            previous anesthesia was also reviewed. The risks                            and benefits of the procedure and the sedation                            options and risks were discussed with the patient.                            All questions were answered, and informed consent                            was obtained. Prior Anticoagulants: The patient has                            taken no anticoagulant or antiplatelet agents. ASA                            Grade Assessment: I - A normal, healthy patient.                            After reviewing the risks and benefits, the patient                            was deemed in satisfactory condition to undergo the                            procedure.  After obtaining informed consent, the colonoscope                            was passed under direct vision. Throughout the                            procedure, the patient's blood pressure, pulse, and                            oxygen saturations were monitored continuously. The                            [id number]-HQ190L Colonoscope [id number] was introduced                            through the anus and advanced to the the  terminal                            ileum, with identification of the appendiceal                            orifice and IC valve. The colonoscopy was performed                            without difficulty. The patient tolerated the                            procedure well. The quality of the bowel                            preparation was good. The terminal ileum, ileocecal                            valve, appendiceal orifice, and rectum were                            photographed. Scope In: 3:29:51 PM Scope Out: 3:47:33 PM Scope Withdrawal Time: 0 hours 14 minutes 7 seconds  Total Procedure Duration: 0 hours 17 minutes 42 seconds  Findings:                 The perianal exam was abnormal. Skin tag vs.                            possible condyloma - suspect scarring / skin tag /                            reactive. No fistula or mass lesions.                           The terminal ileum appeared normal.                           A 3 mm polyp was found in the hepatic flexure. The  polyp was sessile. The polyp was removed with a                            cold snare. Resection and retrieval were complete.                           The colon was rather tortuous.                           Internal hemorrhoids were found during retroflexion.                           The exam was otherwise without abnormality. Complications:            No immediate complications. Estimated blood loss:                            Minimal. Estimated Blood Loss:     Estimated blood loss was minimal. Impression:               - Abnormal perianal exam as outlined - this is                            benign appearing, likely variation of skin tag.                           - The examined portion of the ileum was normal.                           - One 3 mm polyp at the hepatic flexure, removed                            with a cold snare. Resected and retrieved.                            - Tortuous colon.                           - Internal hemorrhoids.                           - The examination was otherwise normal.                           - The GI Genius (intelligent endoscopy module),                            computer-aided polyp detection system powered by AI                            was utilized to detect colorectal polyps through                            enhanced visualization during colonoscopy.  Hemorrhoids are the cause of prior rectal bleeding.                            If rare symptoms can use fiber supplement and                            Calmol4 suppositories PRN OTC. If frequent symptoms                            consideration for hemorrhoid banding. While                            perianal finding appears benign, would recommend                            colorectal surgery evaluation to make sure of that. Recommendation:           - Patient has a contact number available for                            emergencies. The signs and symptoms of potential                            delayed complications were discussed with the                            patient. Return to normal activities tomorrow.                            Written discharge instructions were provided to the                            patient.                           - Resume previous diet.                           - Continue present medications.                           - Await pathology results.                           - Referral to colorectal surgery to evaluate                            perianal lesion as outlined Viviann Spare P. Olof Marcil, MD 04/27/2022 3:54:02 PM This report has been signed electronically.

## 2022-04-27 NOTE — Patient Instructions (Addendum)
Impression/Recommendations:  Polyp and hemorrhoid handouts given to patient.  Resume previous diet. Continue present medications. Await pathology results.  Referral to colorectal surgery to evaluate perianal lesion as outlined.  YOU HAD AN ENDOSCOPIC PROCEDURE TODAY AT THE [facility]:   Refer to the procedure report that was given to you for any specific questions about what was found during the examination.  If the procedure report does not answer your questions, please call your gastroenterologist to clarify.  If you requested that your care partner not be given the details of your procedure findings, then the procedure report has been included in a sealed envelope for you to review at your convenience later.  YOU SHOULD EXPECT: Some feelings of bloating in the abdomen. Passage of more gas than usual.  Walking can help get rid of the air that was put into your GI tract during the procedure and reduce the bloating. If you had a lower endoscopy (such as a colonoscopy or flexible sigmoidoscopy) you may notice spotting of blood in your stool or on the toilet paper. If you underwent a bowel prep for your procedure, you may not have a normal bowel movement for a few days.  Please Note:  You might notice some irritation and congestion in your nose or some drainage.  This is from the oxygen used during your procedure.  There is no need for concern and it should clear up in a day or so.  SYMPTOMS TO REPORT IMMEDIATELY:  Following lower endoscopy (colonoscopy or flexible sigmoidoscopy):  Excessive amounts of blood in the stool  Significant tenderness or worsening of abdominal pains  Swelling of the abdomen that is new, acute  Fever of 100F or higher For urgent or emergent issues, a gastroenterologist can be reached at any hour by calling [phone]. Do not use MyChart messaging for urgent concerns.    DIET:  We do recommend a small meal at first, but then you may proceed to your  regular diet.  Drink plenty of fluids but you should avoid alcoholic beverages for 24 hours.  ACTIVITY:  You should plan to take it easy for the rest of today and you should NOT DRIVE or use heavy machinery until tomorrow (because of the sedation medicines used during the test).    FOLLOW UP: Our staff will call the number listed on your records the next business day following your procedure.  We will call around 7:15- 8:00 am to check on you and address any questions or concerns that you may have regarding the information given to you following your procedure. If we do not reach you, we will leave a message.     If any biopsies were taken you will be contacted by phone or by letter within the next 1-3 weeks.  Please call us at [phone] if you have not heard about the biopsies in 3 weeks.    SIGNATURES/CONFIDENTIALITY: You and/or your care partner have signed paperwork which will be entered into your electronic medical record.  These signatures attest to the fact that that the information above on your After Visit Summary has been reviewed and is understood.  Full responsibility of the confidentiality of this discharge information lies with you and/or your care-partner.

## 2022-04-28 ENCOUNTER — Telehealth: Payer: Self-pay | Admitting: *Deleted

## 2022-04-28 ENCOUNTER — Telehealth: Payer: Self-pay

## 2022-04-28 NOTE — Telephone Encounter (Signed)
  Follow up Call-     04/27/2022    2:42 PM  Call back number  Post procedure Call Back phone  # [phone]  Permission to leave phone message Yes     Patient questions:  Do you have a fever, pain , or abdominal swelling? No. Pain Score  0 *  Have you tolerated food without any problems? Yes.    Have you been able to return to your normal activities? Yes.    Do you have any questions about your discharge instructions: Diet   No. Medications  No. Follow up visit  No.  Do you have questions or concerns about your Care? No.  Actions: * If pain score is 4 or above: No action needed, pain <4.

## 2022-04-28 NOTE — Telephone Encounter (Signed)
-----   Message from Benancio Deeds, MD sent at 04/27/2022  4:48 PM EDT ----- Regarding: referral to [facility] Jan can you please refer this patient to [facility] for abnormal perianal exam? Thanks

## 2022-04-28 NOTE — Telephone Encounter (Signed)
Referral faxed to [facility] 

## 2022-04-29 ENCOUNTER — Encounter (HOSPITAL_BASED_OUTPATIENT_CLINIC_OR_DEPARTMENT_OTHER): Payer: Self-pay | Admitting: Physical Therapy

## 2022-04-29 ENCOUNTER — Ambulatory Visit (HOSPITAL_BASED_OUTPATIENT_CLINIC_OR_DEPARTMENT_OTHER): Payer: 59 | Attending: Family Medicine | Admitting: Physical Therapy

## 2022-04-29 DIAGNOSIS — R2689 Other abnormalities of gait and mobility: Secondary | ICD-10-CM | POA: Diagnosis not present

## 2022-04-29 DIAGNOSIS — M25551 Pain in right hip: Secondary | ICD-10-CM | POA: Insufficient documentation

## 2022-04-29 DIAGNOSIS — R262 Difficulty in walking, not elsewhere classified: Secondary | ICD-10-CM | POA: Insufficient documentation

## 2022-04-29 NOTE — Therapy (Signed)
OUTPATIENT PHYSICAL THERAPY THORACOLUMBAR EVALUATION   Patient Name: Justin Morrow MRN: [id number] DOB:1985-11-08, 37 y.o., male Today's Date: 04/29/2022  END OF SESSION:  PT End of Session - 04/29/22 0935     Visit Number 11    Date for PT Re-Evaluation 06/12/22    Authorization Type [facility] [employer]    PT Start Time 0933    PT Stop Time 1015    PT Time Calculation (min) 42 min    Activity Tolerance Patient tolerated treatment well    Behavior During Therapy [facility] for tasks assessed/performed                Past Medical History:  Diagnosis Date   Kidney stone 2020   Past Surgical History:  Procedure Laterality Date   KNEE ARTHROSCOPY  1999   SPINE SURGERY  12/29/2005   Patient Active Problem List   Diagnosis Date Noted   Blood in stool 02/17/2022   Degeneration of lumbar intervertebral disc 04/28/2021   Lumbar spondylosis 04/28/2021   Wellness examination 04/28/2021   Gross hematuria 10/25/2020   Ischial bursitis of left side 10/08/2020   Spinal stenosis at L4-L5 level 05/29/2020   Lumbosacral radiculopathy 05/23/2020   Low back pain 04/24/2020   Hip pain 04/24/2020   Strain of calf muscle 04/24/2020   Prediabetes 04/24/2020   Hyperlipidemia 04/24/2020   Transaminitis 04/24/2020    REFERRING PROVIDER: Ihor Dow, MD  REFERRING DIAG:  M79.606 (ICD-10-CM) - Pain of lower extremity, unspecified laterality     Rationale for Evaluation and Treatment: Rehabilitation  THERAPY DIAG:  Pain in right hip  Difficulty in walking, not elsewhere classified  ONSET DATE: a few weeks ago  SUBJECTIVE:                                                                                                                                                                                           SUBJECTIVE STATEMENT: Everything I was doing wasn't working. Denny Peon started doing some isometric abd/add and hip flexion and it has really helped. Any time I try doing the stretches, it flares  the HS- have been focusing on active hamstring stretch. Deep tissue massage this afternoon.    PERTINENT HISTORY:  H/o back pain  PAIN:  Are you having pain? Yes: NPRS scale: n/a/10 Pain location: rt buttock and HS Pain description: tight Aggravating factors: standing still, bending/stretching Relieving factors: moving  PRECAUTIONS: None  WEIGHT BEARING RESTRICTIONS: No  FALLS:  Has patient fallen in last 6 months? No  OCCUPATION: psyD  PLOF: Independent  PATIENT GOALS: decrease pain, back to activity    OBJECTIVE:   POSTURE:  Anterior lean  of the body and standing when viewing laterally  2/9: Rt lateral shift of thoracic spine with decrease kyphosis, decr lumbar lordosis and slight forward flexion  2/12: 5 deg Rt shift in lumbar spine from midline of LEs, Rt deg tilt inferiorly on the right per inclinometer  03/30/22 +Thomas test bilaterally, worse on Rt  3/25: standing at 5 deg trunk flexion with bend at hips  PALPATION: Tightness limiting Rt LE ADD Tightness noted in bil HS and piriformis with tenderness to Rt side  3/5: no pain in HS MMT but feels weak and tension when added with IR/ER at hip        Concordant Pain in sidelying hip abd+extension at lateral gluts                   Distal pain with compression to Rt lateral L3 both at muscular level as well as joint mob- not consistent with neural input       LUMBAR ROM:   2/9- WFL with notable correction of shift in further flexion  LOWER EXTREMITY ROM:     Within functional limits  LOWER EXTREMITY MMT:    Gross 5 out of 5 hip abduction with discomfort on the right side GAIT: Distance walked: Ambulated within clinic Assistive device utilized: None Level of assistance: Complete Independence Comments: Right foot strike lateral to medial versus heel-to-toe on the left side  TODAY'S TREATMENT:                                                                                                                                Treatment                            04/29/22:  Trigger Point Dry Needling, Manual Therapy Treatment:  Initial or subsequent education regarding Trigger Point Dry Needling: Initial Did patient give consent to treatment with Trigger Point Dry Needling: Yes TPDN with skilled palpation and monitoring followed by STM to the following muscles: Rt piriformis, glut med/min/TFL  Qped rocking to child pose Down dog with cues to maintain rounded lumbar spine.    Treatment                            04/06/22:  Trigger Point Dry Needling, Manual Therapy Treatment:  Initial or subsequent education regarding Trigger Point Dry Needling: Subsequent Did patient give consent to treatment with Trigger Point Dry Needling: Yes TPDN with skilled palpation and monitoring followed by STM to the following muscles: bil HS in prone  STM to bil HS following DN Active Hamstring stretch Mod thomas stretch   Treatment                            03/30/22:  Trigger Point Dry Needling, Manual Therapy Treatment:  Initial or subsequent education regarding  Trigger Point Dry Needling: Subsequent Did patient give consent to treatment with Trigger Point Dry Needling: Yes TPDN with skilled palpation and monitoring followed by STM to the following muscles: Rt paraspinals L1-L5  Rt sidelying Lt to right lumbar glides  Thomas test stretch bil Right sidelying clams Qped hip ext Tall kneeling upright with glut set- heels of hands pressing to bil SIJ.    PATIENT EDUCATION:  Education details: Teacher, music of condition, POC, HEP, exercise form/rationale Person educated: Patient Education method: Explanation, Demonstration, Tactile cues, Verbal cues, and Handouts Education comprehension: verbalized understanding, returned demonstration, verbal cues required, tactile cues required, and needs further education  HOME EXERCISE PROGRAM: Twice daily: Seated hamstring stretch seated piriformis stretch standing gastroc  stretch [id number]  ASSESSMENT:  CLINICAL IMPRESSION: Improved pain levels with isometric activations to hip flexors, abductors, & adductors. As open chain stretching is uncomfortable, utilized yoga- style stretches to encourage lengthening with contraction/support from surrounding musculature.   OBJECTIVE IMPAIRMENTS: Abnormal gait, decreased activity tolerance, impaired flexibility, improper body mechanics, and pain.   ACTIVITY LIMITATIONS: standing, squatting, and runnin  PARTICIPATION LIMITATIONS: community activity  PERSONAL FACTORS: 1 comorbidity: h/o back pain  are also affecting patient's functional outcome.   REHAB POTENTIAL: Good  CLINICAL DECISION MAKING: Stable/uncomplicated  EVALUATION COMPLEXITY: Low   GOALS: Goals reviewed with patient? Yes  LONG TERM GOALS: Target date: POC date  Pt will demo proper form in long term stretching program Baseline:  Goal status: achieved  2.  Able to jog without distal symptoms Baseline: has not jogged Goal status: not measured  3.  Resolution of HS pain and excessive tightness Baseline: tightness is on and off, primarily in the AM Goal status: ongoing  4.  Demo upright , neutral posture Baseline: slighlty flexed posture through pelvis Goal status: ongoing 5.  Able to stand from charis without sharp pain in Rt proximal HS/gluts consistently for at least 3 days in a row Baseline: less frequent Goal status: ongoing. 6.  Able to lay on either side at night Baseline:  Goal status: INITIAL      PLAN:  PT FREQUENCY: 1x/week  PT DURATION: 6 weeks   PLANNED INTERVENTIONS: Therapeutic exercises, Therapeutic activity, Neuromuscular re-education, Balance training, Gait training, Patient/Family education, Self Care, Joint mobilization, Joint manipulation, Stair training, Aquatic Therapy, Dry Needling, Electrical stimulation, Spinal mobilization, Cryotherapy, Moist heat, Taping, Ionotophoresis /ml Dexamethasone, and  Manual therapy.  PLAN FOR NEXT SESSION: DN PRN, isometric stabilization, outcome of yoga stretches?   Shalon Salado C. Kayvan Hoefling PT, DPT 04/29/22 7:17 PM

## 2022-04-30 DIAGNOSIS — D485 Neoplasm of uncertain behavior of skin: Secondary | ICD-10-CM | POA: Diagnosis not present

## 2022-05-06 ENCOUNTER — Encounter (HOSPITAL_BASED_OUTPATIENT_CLINIC_OR_DEPARTMENT_OTHER): Payer: Self-pay | Admitting: Physical Therapy

## 2022-05-06 ENCOUNTER — Ambulatory Visit (HOSPITAL_BASED_OUTPATIENT_CLINIC_OR_DEPARTMENT_OTHER): Payer: 59 | Admitting: Physical Therapy

## 2022-05-06 DIAGNOSIS — M25551 Pain in right hip: Secondary | ICD-10-CM

## 2022-05-06 DIAGNOSIS — R262 Difficulty in walking, not elsewhere classified: Secondary | ICD-10-CM

## 2022-05-06 DIAGNOSIS — R2689 Other abnormalities of gait and mobility: Secondary | ICD-10-CM | POA: Diagnosis not present

## 2022-05-06 NOTE — Therapy (Signed)
OUTPATIENT PHYSICAL THERAPY THORACOLUMBAR EVALUATION   Patient Name: Justin Morrow MRN: [id number] DOB:1985-07-28, 37 y.o., male Today's Date: 05/06/2022  END OF SESSION:  PT End of Session - 05/06/22 1150     Visit Number 12    Date for PT Re-Evaluation 06/12/22    Authorization Type [facility] [employer]    PT Start Time 1150    PT Stop Time 1220    PT Time Calculation (min) 30 min    Activity Tolerance Patient tolerated treatment well    Behavior During Therapy [facility] for tasks assessed/performed                 Past Medical History:  Diagnosis Date   Kidney stone 2020   Past Surgical History:  Procedure Laterality Date   KNEE ARTHROSCOPY  1999   SPINE SURGERY  12/29/2005   Patient Active Problem List   Diagnosis Date Noted   Blood in stool 02/17/2022   Degeneration of lumbar intervertebral disc 04/28/2021   Lumbar spondylosis 04/28/2021   Wellness examination 04/28/2021   Gross hematuria 10/25/2020   Ischial bursitis of left side 10/08/2020   Spinal stenosis at L4-L5 level 05/29/2020   Lumbosacral radiculopathy 05/23/2020   Low back pain 04/24/2020   Hip pain 04/24/2020   Strain of calf muscle 04/24/2020   Prediabetes 04/24/2020   Hyperlipidemia 04/24/2020   Transaminitis 04/24/2020    REFERRING PROVIDER: Ihor Dow, MD  REFERRING DIAG:  M79.606 (ICD-10-CM) - Pain of lower extremity, unspecified laterality     Rationale for Evaluation and Treatment: Rehabilitation  THERAPY DIAG:  Pain in right hip  Difficulty in walking, not elsewhere classified  Other abnormalities of gait and mobility  ONSET DATE: a few weeks ago  SUBJECTIVE:                                                                                                                                                                                           SUBJECTIVE STATEMENT: Still doing pelvic MET. Carried a box with new bed into the house. Was fine the next day after stim and massage. Felt the  yoga-based exercises have been helpful. Noting that gait speed is increasing without symptom onset. I walked outside for the first time in months with success.    PERTINENT HISTORY:  H/o back pain  PAIN:  Are you having pain? Yes: NPRS scale: n/a/10 Pain location: rt buttock and HS Pain description: tight Aggravating factors: standing still, bending/stretching Relieving factors: moving  PRECAUTIONS: None  WEIGHT BEARING RESTRICTIONS: No  FALLS:  Has patient fallen in last 6 months? No  OCCUPATION: psyD  PLOF: Independent  PATIENT GOALS: decrease  pain, back to activity    OBJECTIVE:   POSTURE:  Anterior lean of the body and standing when viewing laterally  2/9: Rt lateral shift of thoracic spine with decrease kyphosis, decr lumbar lordosis and slight forward flexion  2/12: 5 deg Rt shift in lumbar spine from midline of LEs, Rt deg tilt inferiorly on the right per inclinometer  03/30/22 +Thomas test bilaterally, worse on Rt  3/25: standing at 5 deg trunk flexion with bend at hips  PALPATION: Tightness limiting Rt LE ADD Tightness noted in bil HS and piriformis with tenderness to Rt side  3/5: no pain in HS MMT but feels weak and tension when added with IR/ER at hip        Concordant Pain in sidelying hip abd+extension at lateral gluts                   Distal pain with compression to Rt lateral L3 both at muscular level as well as joint mob- not consistent with neural input       LUMBAR ROM:   2/9- WFL with notable correction of shift in further flexion  LOWER EXTREMITY ROM:     Within functional limits  LOWER EXTREMITY MMT:    Gross 5 out of 5 hip abduction with discomfort on the right side GAIT: Distance walked: Ambulated within clinic Assistive device utilized: None Level of assistance: Complete Independence Comments: Right foot strike lateral to medial versus heel-to-toe on the left side  TODAY'S TREATMENT:                                                                                                                                Treatment                            4/24:  Trigger Point Dry Needling, Manual Therapy Treatment:  Initial or subsequent education regarding Trigger Point Dry Needling: Subsequent Did patient give consent to treatment with Trigger Point Dry Needling: Yes TPDN with skilled palpation and monitoring followed by STM to the following muscles: Rt glut max/med/min, TFL  Reviewed progression of HEP and management as he transitions to job   Treatment                            04/29/22:  Trigger Point Dry Needling, Manual Therapy Treatment:  Initial or subsequent education regarding Trigger Point Dry Needling: Initial Did patient give consent to treatment with Trigger Point Dry Needling: Yes TPDN with skilled palpation and monitoring followed by STM to the following muscles: Rt piriformis, glut med/min/TFL  Qped rocking to child pose Down dog with cues to maintain rounded lumbar spine.    Treatment                            04/06/22:  Trigger Point Dry Needling, Manual Therapy Treatment:  Initial or subsequent education regarding Trigger Point Dry Needling: Subsequent Did patient give consent to treatment with Trigger Point Dry Needling: Yes TPDN with skilled palpation and monitoring followed by STM to the following muscles: bil HS in prone  STM to bil HS following DN Active Hamstring stretch Mod thomas stretch    PATIENT EDUCATION:  Education details: Anatomy of condition, POC, HEP, exercise form/rationale Person educated: Patient Education method: Programmer, multimedia, Demonstration, Tactile cues, Verbal cues, and Handouts Education comprehension: verbalized understanding, returned demonstration, verbal cues required, tactile cues required, and needs further education  HOME EXERCISE PROGRAM: Twice daily: Seated hamstring stretch seated piriformis stretch standing gastroc  stretch [id number]  ASSESSMENT:  CLINICAL IMPRESSION: Pt has met all of his goals at this time and is prepared for d/c to independent HEP. Encouraged him to contact me with any further questions.   OBJECTIVE IMPAIRMENTS: Abnormal gait, decreased activity tolerance, impaired flexibility, improper body mechanics, and pain.   ACTIVITY LIMITATIONS: standing, squatting, and runnin  PARTICIPATION LIMITATIONS: community activity  PERSONAL FACTORS: 1 comorbidity: h/o back pain  are also affecting patient's functional outcome.   REHAB POTENTIAL: Good  CLINICAL DECISION MAKING: Stable/uncomplicated  EVALUATION COMPLEXITY: Low   GOALS: Goals reviewed with patient? Yes  LONG TERM GOALS: Target date: POC date  Pt will demo proper form in long term stretching program Baseline:  Goal status: achieved  2.  Able to jog without distal symptoms Baseline: has not jogged but able to walk outside Goal status: ongoing  3.  Resolution of HS pain and excessive tightness Baseline:  Goal status: achieved  4.  Demo upright , neutral posture Baseline:  Goal status: achieved 5.  Able to stand from charis without sharp pain in Rt proximal HS/gluts consistently for at least 3 days in a row Baseline:  Goal status: achieved. 6.  Able to lay on either side at night Baseline:  Goal status:achieved      PLAN:  PT FREQUENCY: 1x/week  PT DURATION: 6 weeks   PLANNED INTERVENTIONS: Therapeutic exercises, Therapeutic activity, Neuromuscular re-education, Balance training, Gait training, Patient/Family education, Self Care, Joint mobilization, Joint manipulation, Stair training, Aquatic Therapy, Dry Needling, Electrical stimulation, Spinal mobilization, Cryotherapy, Moist heat, Taping, Ionotophoresis /ml Dexamethasone, and Manual therapy.    Samuele Storey C. Macklen Wilhoite PT, DPT 05/06/22 7:48 PM

## 2022-05-07 ENCOUNTER — Ambulatory Visit: Payer: 59 | Admitting: Sports Medicine

## 2022-05-07 NOTE — Progress Notes (Signed)
Aleen Sells D.[facility] [address] [place] [address] Phone: [phone]   Assessment and Plan:     1. Muscle strain of right gluteal region, subsequent encounter 2. Somatic dysfunction of lumbar region 3. Somatic dysfunction of pelvic region 4. Somatic dysfunction of sacral region -Chronic with exacerbation, subsequent visit - Overall improvement in low right back pain and gluteal pain, however still having intermittent flares of pain, primarily in the morning - Continue HEP.  Patient finished physical therapy - Start meloxicam 15 mg daily x2 weeks.  If still having pain after 2 weeks, complete 3rd-week of meloxicam. May use remaining meloxicam as needed once daily for pain control.  Do not to use additional NSAIDs while taking meloxicam.  May use Tylenol [phone] mg 2 to 3 times a day for breakthrough pain. - Patient has received significant relief with OMT in the past.  Elects for repeat OMT today.  Tolerated well per note below. - Decision today to treat with OMT was based on Physical Exam  After verbal consent patient was treated with HVLA (high velocity low amplitude), ME (muscle energy), FPR (flex positional release), ST (soft tissue), PC/PD (Pelvic Compression/ Pelvic Decompression) techniques in sacrum, lumbar, and pelvic areas. Patient tolerated the procedure well with improvement in symptoms.  Patient educated on potential side effects of soreness and recommended to rest, hydrate, and use Tylenol as needed for pain control.  Other orders - meloxicam (MOBIC) 15 MG tablet; Take 1 tablet (15 mg total) by mouth daily.    Pertinent previous records reviewed include none   Follow Up: As needed.  Patient is in the process of changing insurances and is not sure if his new insurance will still cover OMT   Subjective:   I, Moenique Parris, am serving as a Neurosurgeon for Doctor Richardean Sale   Chief Complaint: back and hip pain   HPI:     04/02/2022 Patient is a 37 year old male complaining of back and hip pain. Patient states that he is a running this fall hamstring pain ,went to PT dry needled and it helped some, 02/20/2022 PT put him in a prone superman tilt with a 4 lbs weight around his ankle, that's when the hip pain started, Pt now thinks he has a hip issue, massage therapist told him that he is tight from the ql down , he can feel a pulling from his hamstrings down , he was told he was tilted. Intermittent tingling down into the foot, pain radiates down to the knee, no meds for the pain , pain first thing in the morning but as the day gets going it loosens up , laying on his right side he can get some relief, movement and stretching helps    04/23/2022 Patient states continued lower back pain radiating into the hips and glutes. Minimal relief with massage. Wife did some alignment work, that has been the most beneficial. Sharp shooting pains occurring less often. Less hamstring issues. Primary pain in the gluteal region.    05/08/2022 Patient states that he is doing better not perfect but slowly  getting better , tension in the glute and low back area     Relevant Historical Information: Spinal stenosis at L4-5 Additional pertinent review of systems negative.   Current Outpatient Medications:    hydrocortisone (ANUSOL-HC) 2.5 % rectal cream, Place 1 Application rectally 2 (two) times daily., Disp: 30 g, Rfl: 2   meloxicam (MOBIC) 15 MG tablet, Take 1  tablet (15 mg total) by mouth daily., Disp: 30 tablet, Rfl: 0   meloxicam (MOBIC) 15 MG tablet, Take 1 tablet (15 mg total) by mouth daily., Disp: 30 tablet, Rfl: 0   Objective:     Vitals:   05/08/22 0753  BP: 110/68  Pulse: 87  SpO2: 99%  Weight: 141 lb (64 kg)  Height: 5\' 6"  (1.676 m)      Body mass index is 22.76 kg/m.    Physical Exam:    General: Well-appearing, cooperative, sitting comfortably in no acute distress.   OMT Physical Exam:  ASIS  Compression Test: Positive Right Sacrum: NTTP bilateral sacral base, negative sphinx Lumbar: TTP paraspinal L3-L5 on right, L2-4 RRSL Pelvis: Right anterior innominate    Electronically signed by:  Aleen Sells D.[facility] 8:13 AM 05/08/22

## 2022-05-08 ENCOUNTER — Ambulatory Visit (INDEPENDENT_AMBULATORY_CARE_PROVIDER_SITE_OTHER): Payer: 59 | Admitting: Sports Medicine

## 2022-05-08 VITALS — BP 110/68 | HR 87 | Ht 66.0 in | Wt 141.0 lb

## 2022-05-08 DIAGNOSIS — M9905 Segmental and somatic dysfunction of pelvic region: Secondary | ICD-10-CM

## 2022-05-08 DIAGNOSIS — M9903 Segmental and somatic dysfunction of lumbar region: Secondary | ICD-10-CM | POA: Diagnosis not present

## 2022-05-08 DIAGNOSIS — S76011D Strain of muscle, fascia and tendon of right hip, subsequent encounter: Secondary | ICD-10-CM | POA: Diagnosis not present

## 2022-05-08 DIAGNOSIS — M9904 Segmental and somatic dysfunction of sacral region: Secondary | ICD-10-CM | POA: Diagnosis not present

## 2022-05-08 MED ORDER — MELOXICAM 15 MG PO TABS: 15.0000 mg | ORAL_TABLET | Freq: Every day | ORAL | 0 refills | Status: AC

## 2022-05-08 NOTE — Patient Instructions (Addendum)
Good to see you - Start meloxicam 15 mg daily x2 weeks.  If still having pain after 2 weeks, complete 3rd-week of meloxicam. May use remaining meloxicam as needed once daily for pain control.  Do not to use additional NSAIDs while taking meloxicam.  May use Tylenol [phone] mg 2 to 3 times a day for breakthrough pain. Continue HEP  As needed follow up

## 2022-05-11 ENCOUNTER — Encounter (HOSPITAL_BASED_OUTPATIENT_CLINIC_OR_DEPARTMENT_OTHER): Payer: Self-pay | Admitting: Physical Therapy

## 2022-05-11 NOTE — Telephone Encounter (Signed)
Patient has an appointment with Dr. Michaell Cowing on 5-8 at 2:30pm

## 2022-05-27 ENCOUNTER — Encounter: Payer: Self-pay | Admitting: Gastroenterology

## 2022-05-28 NOTE — Telephone Encounter (Signed)
[facility] CLINIC REFERRAL INFORMATION Gastrointestinal  [phone]. Fax: [phone]. Referral faxed for: perianal lesion

## 2022-06-02 NOTE — Progress Notes (Unsigned)
    Justin Morrow D.[facility] [address] [place] [address] Phone: [phone]   Assessment and Plan:     There are no diagnoses linked to this encounter.  ***   Pertinent previous records reviewed include ***   Follow Up: ***     Subjective:   I, Justin Morrow, am serving as a Neurosurgeon for Doctor Justin Morrow   Chief Complaint: back and hip pain   HPI:    04/02/2022 Patient is a 37 year old male complaining of back and hip pain. Patient states that he is a running this fall hamstring pain ,went to PT dry needled and it helped some, 02/20/2022 PT put him in a prone superman tilt with a 4 lbs weight around his ankle, that's when the hip pain started, Pt now thinks he has a hip issue, massage therapist told him that he is tight from the ql down , he can feel a pulling from his hamstrings down , he was told he was tilted. Intermittent tingling down into the foot, pain radiates down to the knee, no meds for the pain , pain first thing in the morning but as the day gets going it loosens up , laying on his right side he can get some relief, movement and stretching helps    04/23/2022 Patient states continued lower back pain radiating into the hips and glutes. Minimal relief with massage. Wife did some alignment work, that has been the most beneficial. Sharp shooting pains occurring less often. Less hamstring issues. Primary pain in the gluteal region.    05/08/2022 Patient states that he is doing better not perfect but slowly  getting better , tension in the glute and low back area    06/03/2022 Patient states   Relevant Historical Information: Spinal stenosis at L4-5  Additional pertinent review of systems negative.   Current Outpatient Medications:    hydrocortisone (ANUSOL-HC) 2.5 % rectal cream, Place 1 Application rectally 2 (two) times daily., Disp: 30 g, Rfl: 2   meloxicam (MOBIC) 15 MG tablet, Take 1 tablet (15 mg total) by mouth  daily., Disp: 30 tablet, Rfl: 0   meloxicam (MOBIC) 15 MG tablet, Take 1 tablet (15 mg total) by mouth daily., Disp: 30 tablet, Rfl: 0   Objective:     There were no vitals filed for this visit.    There is no height or weight on file to calculate BMI.    Physical Exam:    ***   Electronically signed by:  Justin Morrow D.[facility] 3:42 PM 06/02/22

## 2022-06-03 ENCOUNTER — Ambulatory Visit: Payer: BC Managed Care – PPO | Admitting: Sports Medicine

## 2022-06-03 VITALS — BP 122/80 | HR 81 | Ht 66.0 in | Wt 142.0 lb

## 2022-06-03 DIAGNOSIS — S76011D Strain of muscle, fascia and tendon of right hip, subsequent encounter: Secondary | ICD-10-CM | POA: Diagnosis not present

## 2022-06-03 DIAGNOSIS — M5136 Other intervertebral disc degeneration, lumbar region: Secondary | ICD-10-CM | POA: Diagnosis not present

## 2022-06-03 DIAGNOSIS — M5441 Lumbago with sciatica, right side: Secondary | ICD-10-CM | POA: Diagnosis not present

## 2022-06-03 DIAGNOSIS — G8929 Other chronic pain: Secondary | ICD-10-CM

## 2022-06-03 NOTE — Patient Instructions (Signed)
Epidural referral right L4-5 Low back HEP  Continue tylenol as needed and use meloxicam as needed for breakthrough pain  Recommend tennis shoes or cushioned inserts Follow up 2 weeks after epidural to discuss results

## 2022-06-03 NOTE — Telephone Encounter (Signed)
Called [facility] at [phone] to check status of referral.  It is "under review". Patient will be contacted within a couple of days for scheduling.

## 2022-09-10 ENCOUNTER — Ambulatory Visit (INDEPENDENT_AMBULATORY_CARE_PROVIDER_SITE_OTHER): Payer: BC Managed Care – PPO | Admitting: Family Medicine

## 2022-09-10 VITALS — BP 117/76 | HR 96 | Temp 99.4°F | Resp 16 | Ht 66.0 in | Wt 141.4 lb

## 2022-09-10 DIAGNOSIS — Z20818 Contact with and (suspected) exposure to other bacterial communicable diseases: Secondary | ICD-10-CM | POA: Diagnosis not present

## 2022-09-10 DIAGNOSIS — U071 COVID-19: Secondary | ICD-10-CM | POA: Diagnosis not present

## 2022-09-10 DIAGNOSIS — M5417 Radiculopathy, lumbosacral region: Secondary | ICD-10-CM | POA: Diagnosis not present

## 2022-09-10 LAB — POCT RAPID STREP A (OFFICE): Rapid Strep A Screen: NEGATIVE

## 2022-09-10 LAB — POC COVID19 BINAXNOW: SARS Coronavirus 2 Ag: POSITIVE — AB

## 2022-09-10 NOTE — Assessment & Plan Note (Signed)
Continue current physical therapy regimen. Encourage regular follow-ups with a physical therapist. Advise the patient to avoid activities that may exacerbate the strain.

## 2022-09-10 NOTE — Progress Notes (Signed)
Assessment/Plan:   Problem List Items Addressed This Visit       Nervous and Auditory   Lumbosacral radiculopathy    Continue current physical therapy regimen. Encourage regular follow-ups with a physical therapist. Advise the patient to avoid activities that may exacerbate the strain.        Other   COVID-19 - Primary     Mild disease.  Plan: Quarantine for 5 days from the onset of symptoms. Masking indoors and outdoors around people for an additional 5 days after quarantine. Continue using Afrin cautiously for nasal congestion. Symptomatic treatment includes over-the-counter medications for relief of malaise and sinus pressure. Educate the patient on the use of pain relievers such as acetaminophen as needed. Provide a work note for sick leave. Follow-up if symptoms worsen or new symptoms develop.      Relevant Orders   POC COVID-19 BinaxNow (Completed)   Strep throat exposure   Relevant Orders   POCT rapid strep A (Completed)    There are no discontinued medications.  No follow-ups on file.    Subjective:   Encounter date: 09/10/2022  Justin Morrow is a 37 y.o. male who has Low back pain; Hip pain; Strain of calf muscle; Prediabetes; Hyperlipidemia; Transaminitis; Lumbosacral radiculopathy; Spinal stenosis at L4-L5 level; Ischial bursitis of left side; Gross hematuria; Degeneration of lumbar intervertebral disc; Lumbar spondylosis; Wellness examination; Blood in stool; COVID-19; and Strep throat exposure on their problem list..   He  has a past medical history of Kidney stone (2020)..   Chief Complaint: COVID-19 infection and general malaise.  History of Present Illness:  COVID-19 Infection: The patient presents with symptoms consistent with COVID-19 including chills, cold sweating, congestion, and a sore throat for approximately 48 hours. He has experienced significant chills and cold sweating on Tuesday night, followed by malaise and congestion primarily  on the subsequent days. Sinus pressure was noted in the forehead, but there are no reports of chest pain, palpitations, or difficulty breathing.  Past Anal Lesion: Approximately a year ago, the patient changed his diet to include home-based nut milk, which resulted in changes in bowel movements. A subsequent colonoscopy revealed an abnormal lesion that was surgically removed but was deemed benign, with no significant findings other than potential chronic inflammation.  Chronic back pain: The patient also has a history of a chronic athlete sports injury which is managed with physical therapy.  Past Surgical History:  Procedure Laterality Date   KNEE ARTHROSCOPY  1999   SPINE SURGERY  12/29/2005    Outpatient Medications Prior to Visit  Medication Sig Dispense Refill   meloxicam (MOBIC) 15 MG tablet Take 1 tablet (15 mg total) by mouth daily. 30 tablet 0   hydrocortisone (ANUSOL-HC) 2.5 % rectal cream Place 1 Application rectally 2 (two) times daily. (Patient not taking: Reported on 09/10/2022) 30 g 2   meloxicam (MOBIC) 15 MG tablet Take 1 tablet (15 mg total) by mouth daily. (Patient not taking: Reported on 09/10/2022) 30 tablet 0   No facility-administered medications prior to visit.    Family History  Problem Relation Age of Onset   Cancer Mother    Hyperlipidemia Father    Hypertension Father    Ulcerative colitis Father    Healthy Sister    Healthy Brother    Dementia Maternal Grandmother    Alzheimer's disease Maternal Grandmother    Cancer Maternal Grandmother    Cancer Maternal Grandfather    Diabetes Paternal Grandmother    Heart disease Paternal Grandfather  Pancreatic cancer Neg Hx    Liver disease Neg Hx     Social History   Socioeconomic History   Marital status: Married    Spouse name: Not on file   Number of children: 2   Years of education: Not on file   Highest education level: Professional school degree (e.g., MD, DDS, DVM, JD)  Occupational History    Not on file  Tobacco Use   Smoking status: Never   Smokeless tobacco: Never  Vaping Use   Vaping status: Never Used  Substance and Sexual Activity   Alcohol use: Not Currently    Alcohol/week: 1.0 standard drink of alcohol    Comment: rarely, every 2-3 months   Drug use: Never   Sexual activity: Yes    Birth control/protection: None  Other Topics Concern   Not on file  Social History Narrative   Not on file   Social Determinants of Health   Financial Resource Strain: Low Risk  (09/10/2022)   Overall Financial Resource Strain (CARDIA)    Difficulty of Paying Living Expenses: Not hard at all  Food Insecurity: No Food Insecurity (09/10/2022)   Hunger Vital Sign    Worried About Running Out of Food in the Last Year: Never true    Ran Out of Food in the Last Year: Never true  Transportation Needs: No Transportation Needs (09/10/2022)   PRAPARE - Administrator, Civil Service (Medical): No    Lack of Transportation (Non-Medical): No  Physical Activity: Sufficiently Active (09/10/2022)   Exercise Vital Sign    Days of Exercise per Week: 7 days    Minutes of Exercise per Session: 30 min  Stress: No Stress Concern Present (09/10/2022)   [employer] of Occupational Health - Occupational Stress Questionnaire    Feeling of Stress : Only a little  Social Connections: Moderately Integrated (09/10/2022)   Social Connection and Isolation Panel [NHANES]    Frequency of Communication with Friends and Family: Three times a week    Frequency of Social Gatherings with Friends and Family: Twice a week    Attends Religious Services: More than 4 times per year    Active Member of [employer] or Organizations: No    Attends Engineer, structural: Not on file    Marital Status: Married  Catering manager Violence: Not on file                                                                                                  Objective:  Physical Exam: BP 117/76 (BP Location: Left  Arm, Patient Position: Sitting, Cuff Size: Normal)   Pulse 96   Temp 99.4 F (37.4 C) (Oral)   Resp 16   Ht 5\' 6"  (1.676 m)   Wt 141 lb 6.4 oz (64.1 kg)   SpO2 99%   BMI 22.82 kg/m     Physical Exam Constitutional:      Appearance: Normal appearance.  HENT:     Head: Normocephalic and atraumatic.     Right Ear: Hearing normal.     Left Ear: Hearing normal.  Nose: Nose normal.  Eyes:     General: No scleral icterus.       Right eye: No discharge.        Left eye: No discharge.     Extraocular Movements: Extraocular movements intact.  Cardiovascular:     Rate and Rhythm: Normal rate and regular rhythm.     Heart sounds: Normal heart sounds.  Pulmonary:     Effort: Pulmonary effort is normal.     Breath sounds: Normal breath sounds.  Abdominal:     Palpations: Abdomen is soft.     Tenderness: There is no abdominal tenderness.  Skin:    General: Skin is warm.     Findings: No rash.  Neurological:     General: No focal deficit present.     Mental Status: He is alert.     Cranial Nerves: No cranial nerve deficit.  Psychiatric:        Mood and Affect: Mood normal.        Behavior: Behavior normal.        Thought Content: Thought content normal.        Judgment: Judgment normal.     No results found.  Recent Results (from the past 2160 hour(s))  POC COVID-19 BinaxNow     Status: Abnormal   Collection Time: 09/10/22  2:22 PM  Result Value Ref Range   SARS Coronavirus 2 Ag Positive (A) Negative  POCT rapid strep A     Status: Normal   Collection Time: 09/10/22  2:24 PM  Result Value Ref Range   Rapid Strep A Screen Negative Negative        Garner Nash, MD, MS

## 2022-09-10 NOTE — Assessment & Plan Note (Signed)
  Mild disease.  Plan: Quarantine for 5 days from the onset of symptoms. Masking indoors and outdoors around people for an additional 5 days after quarantine. Continue using Afrin cautiously for nasal congestion. Symptomatic treatment includes over-the-counter medications for relief of malaise and sinus pressure. Educate the patient on the use of pain relievers such as acetaminophen as needed. Provide a work note for sick leave. Follow-up if symptoms worsen or new symptoms develop.

## 2022-10-26 ENCOUNTER — Encounter: Payer: Self-pay | Admitting: Family Medicine

## 2022-10-26 DIAGNOSIS — Z302 Encounter for sterilization: Secondary | ICD-10-CM
